# Patient Record
Sex: Male | Born: 1995 | Race: White | Hispanic: No | Marital: Married | Smoking: Never smoker
Health system: Southern US, Community
[De-identification: ages and names within clinical notes are randomized; demographics above are authoritative.]

## PROBLEM LIST (undated history)

## (undated) DIAGNOSIS — K219 Gastro-esophageal reflux disease without esophagitis: Secondary | ICD-10-CM

## (undated) DIAGNOSIS — F419 Anxiety disorder, unspecified: Secondary | ICD-10-CM

## (undated) HISTORY — PX: WISDOM TOOTH EXTRACTION: SHX21

## (undated) HISTORY — PX: TYMPANOSTOMY TUBE PLACEMENT: SHX32

---
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
## 2017-09-16 ENCOUNTER — Ambulatory Visit (HOSPITAL_COMMUNITY)
Admission: RE | Admit: 2017-09-16 | Discharge: 2017-09-16 | Disposition: A | Discharge status: Home / Self Care | Payer: 59 | Source: Ambulatory Visit | Attending: Family Medicine | Admitting: Family Medicine

## 2017-09-16 ENCOUNTER — Encounter: Payer: Self-pay | Admitting: Family Medicine

## 2017-09-16 ENCOUNTER — Ambulatory Visit (INDEPENDENT_AMBULATORY_CARE_PROVIDER_SITE_OTHER): Payer: 59 | Admitting: Family Medicine

## 2017-09-16 VITALS — BP 130/80 | Ht 73.0 in | Wt 218.5 lb

## 2017-09-16 DIAGNOSIS — M25561 Pain in right knee: Secondary | ICD-10-CM | POA: Insufficient documentation

## 2017-09-16 MED ORDER — ETODOLAC 400 MG PO TABS: ORAL_TABLET | ORAL | 3 refills | Status: DC

## 2017-09-16 NOTE — Progress Notes (Signed)
   Subjective:    Patient ID: Bradley Chapman, male    DOB: 10/21/1996, 21 y.o.   MRN: [id number]  Knee Pain   The incident occurred more than 1 week ago. There was no injury mechanism. The pain is present in the right knee. He has tried non-weight bearing, immobilization and rest for the symptoms.   Patient states no other concerns this visit.   machinging rcc,, learning the trade  Five wks ago, when crouching tried to get up and had a sudden severe pain and discomfor t  Had trouble putting pressure on it  On monday jumped out of vehicle and knee locked   Had inability to extend, had to work it out, now painful for the past few days   Took occas ibuprofen  Wore knee brace    Does a lot of walking with job     Review of Systems No headache, no major weight loss or weight gain, no chest pain no back pain abdominal pain no change in bowel habits complete ROS otherwise negative     Objective:   Physical Exam Alert vitals stable, NAD. Blood pressure good on repeat. HEENT normal. Lungs clear. Heart regular rate and rhythm. Right knee good range of motion.  Hyperlaxity noted.  Some medial joint line tenderness.  No obvious effusion.  Cruciate ligaments normal.  Impression       Assessment & Plan:  Impression knee injury.  With strain.  Elman L inflammation.  And intermittent locking.  Will do x-ray and trial of anti-inflammatory/if persists will need further imaging

## 2017-10-04 ENCOUNTER — Telehealth: Payer: Self-pay | Admitting: Family Medicine

## 2017-10-04 NOTE — Telephone Encounter (Signed)
rec ortho referral ot ortho of choice

## 2017-10-04 NOTE — Telephone Encounter (Signed)
Patient seen Dr. Brett CanalesSteve on 09/16/17 for knee pain.  His knee is no better.  Please advise.

## 2017-10-05 ENCOUNTER — Encounter: Payer: Self-pay | Admitting: Family Medicine

## 2017-10-05 ENCOUNTER — Other Ambulatory Visit: Payer: Self-pay | Admitting: *Deleted

## 2017-10-05 DIAGNOSIS — M25569 Pain in unspecified knee: Secondary | ICD-10-CM

## 2017-10-05 NOTE — Telephone Encounter (Signed)
Pt does not have a preference. Order for referral put in. Pt notified.

## 2017-10-07 ENCOUNTER — Encounter (INDEPENDENT_AMBULATORY_CARE_PROVIDER_SITE_OTHER): Payer: Self-pay | Admitting: Orthopaedic Surgery

## 2017-10-07 ENCOUNTER — Ambulatory Visit (INDEPENDENT_AMBULATORY_CARE_PROVIDER_SITE_OTHER): Payer: 59 | Admitting: Orthopaedic Surgery

## 2017-10-07 VITALS — BP 120/82 | HR 96 | Ht 73.0 in | Wt 220.0 lb

## 2017-10-07 DIAGNOSIS — M25561 Pain in right knee: Secondary | ICD-10-CM

## 2017-10-07 DIAGNOSIS — M2391 Unspecified internal derangement of right knee: Secondary | ICD-10-CM | POA: Diagnosis not present

## 2017-10-07 NOTE — Addendum Note (Signed)
Addended by: Rogers SeedsYEATTS, Michell Giuliano M on: 10/07/2017 02:28 PM   Modules accepted: Orders

## 2017-10-07 NOTE — Progress Notes (Signed)
Office Visit Note/orthopedic consultation requested by Dr. Lubertha SouthSteve Luking   Patient: Bradley AkinJacob F Chapman           Date of Birth: 12/01/1995           MRN: [id number] Visit Date: 10/07/2017              Requested by: Merlyn AlbertLuking, William S, MD [address] [place], [place] [address] PCP: Merlyn AlbertLuking, William S, MD   Assessment & Plan: Visit Diagnoses:  1. Knee locking, right     Plan: Patient exam is consistent with old ACL tear but he does not have a history of ligamentous injury.  No history of hemarthrosis or significant effusion.  His lateral joint line tenderness meniscal tear.  Prior to the injury 7 weeks ago.  We will obtain an MRI scan to evaluate his ACL and also the lateral meniscus for potential bucket-handle meniscal tear with repetitive locking.  Office follow-up after MRI scan.  Thank you for the opportunity to see him in consultation.  Follow-Up Instructions: No Follow-up on file.   Orders:  No orders of the defined types were placed in this encounter.  No orders of the defined types were placed in this encounter.     Procedures: No procedures performed   Clinical Data: No additional findings.   Subjective: Chief Complaint  Patient presents with  . Right Knee - Pain    HPI 21 year old injured his knee 2 weeks ago when he was down in a squat started to get up from a crouched position and felt a sudden pop in his knee.  Since that time he said several times were his knee has locked one episode it took him many minutes in order to get his knee out of the stuck position and get it unlocked.  He denies swelling he is to play soccer in high school and has taken some anti-inflammatory medication without relief.  He is tried to use the brace but his knee continues to lock and gives him extreme pain.  No past history of injury to his knee.  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  X-rays were obtained on 09/16/2017 which were normal.  These were reviewed  today on PACS.  Review of Systems for previous surgeries, negative for significant injuries negative for hospitalizations.  He does have some acid reflux and history of anxiety.  His only medication is been anti-inflammatories recently since his knee injury.  Otherwise negative as it pertains HPI 14 point review of systems done.   Objective: Vital Signs: BP 120/82   Pulse 96   Ht 6\' 1"  (1.854 m)   Wt 220 lb (99.8 kg)   BMI 29.03 kg/m   Physical Exam  Constitutional: He is oriented to person, place, and time. He appears well-developed and well-nourished.  HENT:  Head: Normocephalic and atraumatic.  Eyes: EOM are normal. Pupils are equal, round, and reactive to light.  Neck: No tracheal deviation present. No thyromegaly present.  Cardiovascular: Normal rate.  Pulmonary/Chest: Effort normal. He has no wheezes.  Abdominal: Soft. Bowel sounds are normal.  Neurological: He is alert and oriented to person, place, and time.  Skin: Skin is warm and dry. Capillary refill takes less than 2 seconds.  Psychiatric: He has a normal mood and affect. His behavior is normal. Judgment and thought content normal.    Ortho Exam right knee demonstrates trace effusion.  He has a 1-2+ anterior drawer with soft endpoint.  Positive pivot  shift positive Lockman.  Lateral joint line tenderness.  Pain with hyperextension.  He can flex to 120 degrees and then extend and did not lock today on exam.  Collateral ligaments are stable PCL exam is normal. Specialty Comments:  No specialty comments available.  Imaging: No results found.   PMFS History: Patient Active Problem List   Diagnosis Date Noted  . Depression 12/21/2014   Past Medical History:  Diagnosis Date  . Headache(784.0)     Family History  Problem Relation Age of Onset  . Stroke Paternal Grandfather        Died at 2962  . Heart attack Paternal Grandmother        Died at 2872  . Aneurysm Maternal Grandfather        Has Aortic Aneurysm  .  Aneurysm Maternal Grandmother         Aneurysm behind eye    Past Surgical History:  Procedure Laterality Date  . CIRCUMCISION  1999  . TYMPANOSTOMY TUBE PLACEMENT Bilateral 2000, 2001 and 2004   Social History   Occupational History  . Occupation: Consulting civil engineerstudent    Comment: [employer]  Tobacco Use  . Smoking status: Never Smoker  . Smokeless tobacco: Never Used  Substance and Sexual Activity  . Alcohol use: No  . Drug use: No  . Sexual activity: No

## 2017-10-14 ENCOUNTER — Ambulatory Visit (HOSPITAL_COMMUNITY)
Admission: RE | Admit: 2017-10-14 | Discharge: 2017-10-14 | Disposition: A | Discharge status: Home / Self Care | Payer: 59 | Source: Ambulatory Visit | Attending: Orthopaedic Surgery | Admitting: Orthopaedic Surgery

## 2017-10-14 DIAGNOSIS — S83281A Other tear of lateral meniscus, current injury, right knee, initial encounter: Secondary | ICD-10-CM | POA: Insufficient documentation

## 2017-10-14 DIAGNOSIS — M25561 Pain in right knee: Secondary | ICD-10-CM | POA: Insufficient documentation

## 2017-10-14 DIAGNOSIS — X58XXXA Exposure to other specified factors, initial encounter: Secondary | ICD-10-CM | POA: Diagnosis not present

## 2017-10-14 DIAGNOSIS — M2391 Unspecified internal derangement of right knee: Secondary | ICD-10-CM

## 2017-10-18 NOTE — Progress Notes (Signed)
Red white zone at his age I would try repair with acl - decent chance to heal

## 2017-10-20 ENCOUNTER — Ambulatory Visit (INDEPENDENT_AMBULATORY_CARE_PROVIDER_SITE_OTHER): Payer: 59 | Admitting: Orthopedic Surgery

## 2017-10-20 ENCOUNTER — Encounter (INDEPENDENT_AMBULATORY_CARE_PROVIDER_SITE_OTHER): Payer: Self-pay | Admitting: Orthopedic Surgery

## 2017-10-20 DIAGNOSIS — S83511D Sprain of anterior cruciate ligament of right knee, subsequent encounter: Secondary | ICD-10-CM | POA: Diagnosis not present

## 2017-10-21 ENCOUNTER — Ambulatory Visit (INDEPENDENT_AMBULATORY_CARE_PROVIDER_SITE_OTHER): Payer: 59 | Admitting: Orthopaedic Surgery

## 2017-10-21 NOTE — Progress Notes (Signed)
Office Visit Note   Patient: Bradley Chapman           Date of Birth: 09/13/1996           MRN: [id number] Visit Date: 10/20/2017 Requested by: Merlyn AlbertLuking, William S, MD [address] [place], [place] [address] PCP: Merlyn AlbertLuking, William S, MD  Subjective: Chief Complaint  Patient presents with  . Right Knee - Follow-up    HPI: Bradley PilgrimJacob is a 21 year old patient with right knee pain locking and instability.  He had an injury approximately 6 weeks ago.  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  There is no family history or personal history of DVT or pulmonary embolism.  Patient reports discrete locking episodes and lateral joint line tenderness.  His knee has locked about 4 times since the MRI scan.  The patient flexes and then rotates and extends the knee in order to unlock it.              ROS: All systems reviewed are negative as they relate to the chief complaint within the history of present illness.  Patient denies  fevers or chills.   Assessment & Plan: Visit Diagnoses:  1. Rupture of anterior cruciate ligament of right knee, subsequent encounter     Plan: Impression is right knee ACL tear lateral meniscal tear and posterior lateral rotatory instability.  Plan is ACL reconstruction with hamstring autograft likely a fibular based posterior lateral corner reconstruction and attempt at inside out lateral meniscal repair.  Risk and benefits are discussed including but not limited to infection knee stiffness nerve and vessel damage failure of the meniscal repair and the prolonged rehabilitative process involves also discussed.  All questions answered.  This patient is not particularly involved in cutting and pivoting sports.  Follow-Up Instructions: No Follow-up on file.   Orders:  No orders of the defined types were placed in this encounter.  No orders of the defined types were placed in this encounter.     Procedures: No procedures performed   Clinical Data: No additional  findings.  Objective: Vital Signs: There were no vitals taken for this visit.  Physical Exam:   Constitutional: Patient appears well-developed HEENT:  Head: Normocephalic Eyes:EOM are normal Neck: Normal range of motion Cardiovascular: Normal rate Pulmonary/chest: Effort normal Neurologic: Patient is alert Skin: Skin is warm Psychiatric: Patient has normal mood and affect    Ortho Exam: Orthopedic exam demonstrates normal gait and alignment.  ACL laxity is present on the right negative on the left.  The patient's knee is stable to valgus stress at 0 and 30 degrees on the right.  Some opening at 0 and 30 degrees to varus stress more so on the right compared to the left.  There is also about 15 degrees more posterior lateral rotatory instability tested at 15 degrees of flexion on the right compared to the left.  PCL is intact.  There is lateral joint line tenderness on the right.  The patient has full extension and full flexion.  Ankle dorsiflexion and plantarflexion is intact on the right and pedal pulses are palpable and present  Specialty Comments:  No specialty comments available.  Imaging: No results found.   PMFS History: Patient Active Problem List   Diagnosis Date Noted  . Depression 12/21/2014   Past Medical History:  Diagnosis Date  . Headache(784.0)     Family History  Problem Relation Age of Onset  . Stroke Paternal Grandfather  Died at 5562  . Heart attack Paternal Grandmother        Died at 9772  . Aneurysm Maternal Grandfather        Has Aortic Aneurysm  . Aneurysm Maternal Grandmother         Aneurysm behind eye    Past Surgical History:  Procedure Laterality Date  . CIRCUMCISION  1999  . TYMPANOSTOMY TUBE PLACEMENT Bilateral 2000, 2001 and 2004   Social History   Occupational History  . Occupation: Consulting civil engineerstudent    Comment: [employer]  Tobacco Use  . Smoking status: Never Smoker  . Smokeless tobacco: Never Used  Substance and  Sexual Activity  . Alcohol use: No  . Drug use: No  . Sexual activity: No

## 2017-10-26 ENCOUNTER — Other Ambulatory Visit (INDEPENDENT_AMBULATORY_CARE_PROVIDER_SITE_OTHER): Payer: Self-pay | Admitting: Orthopedic Surgery

## 2017-10-26 DIAGNOSIS — S83511A Sprain of anterior cruciate ligament of right knee, initial encounter: Secondary | ICD-10-CM

## 2017-10-28 ENCOUNTER — Other Ambulatory Visit: Payer: Self-pay

## 2017-10-28 ENCOUNTER — Encounter (HOSPITAL_COMMUNITY): Payer: Self-pay | Admitting: *Deleted

## 2017-10-28 NOTE — Progress Notes (Signed)
Spoke with mom, Misty for pre-op call (is pt's DPR). She denies any cardiac history for pt. States he is not diabetic.

## 2017-10-29 ENCOUNTER — Encounter (HOSPITAL_COMMUNITY): Payer: Self-pay | Admitting: Certified Registered Nurse Anesthetist

## 2017-10-29 ENCOUNTER — Ambulatory Visit (HOSPITAL_COMMUNITY): Payer: 59 | Admitting: Certified Registered Nurse Anesthetist

## 2017-10-29 ENCOUNTER — Encounter (INDEPENDENT_AMBULATORY_CARE_PROVIDER_SITE_OTHER): Payer: Self-pay | Admitting: Orthopedic Surgery

## 2017-10-29 ENCOUNTER — Ambulatory Visit (HOSPITAL_COMMUNITY)
Admission: RE | Admit: 2017-10-29 | Discharge: 2017-10-29 | Disposition: A | Discharge status: Home / Self Care | Payer: 59 | Source: Ambulatory Visit | Attending: Orthopedic Surgery | Admitting: Orthopedic Surgery

## 2017-10-29 ENCOUNTER — Encounter (HOSPITAL_COMMUNITY)
Admission: RE | Disposition: A | Discharge status: Home / Self Care | Payer: Self-pay | Source: Ambulatory Visit | Attending: Orthopedic Surgery

## 2017-10-29 DIAGNOSIS — S83289A Other tear of lateral meniscus, current injury, unspecified knee, initial encounter: Secondary | ICD-10-CM | POA: Diagnosis not present

## 2017-10-29 DIAGNOSIS — Z79899 Other long term (current) drug therapy: Secondary | ICD-10-CM | POA: Diagnosis not present

## 2017-10-29 DIAGNOSIS — S83511A Sprain of anterior cruciate ligament of right knee, initial encounter: Secondary | ICD-10-CM

## 2017-10-29 DIAGNOSIS — F329 Major depressive disorder, single episode, unspecified: Secondary | ICD-10-CM | POA: Insufficient documentation

## 2017-10-29 DIAGNOSIS — K219 Gastro-esophageal reflux disease without esophagitis: Secondary | ICD-10-CM | POA: Diagnosis not present

## 2017-10-29 DIAGNOSIS — F419 Anxiety disorder, unspecified: Secondary | ICD-10-CM | POA: Insufficient documentation

## 2017-10-29 DIAGNOSIS — S83281A Other tear of lateral meniscus, current injury, right knee, initial encounter: Secondary | ICD-10-CM | POA: Diagnosis not present

## 2017-10-29 DIAGNOSIS — M2420 Disorder of ligament, unspecified site: Secondary | ICD-10-CM | POA: Diagnosis not present

## 2017-10-29 HISTORY — DX: Gastro-esophageal reflux disease without esophagitis: K21.9

## 2017-10-29 HISTORY — PX: ANTERIOR CRUCIATE LIGAMENT REPAIR: SHX115

## 2017-10-29 HISTORY — DX: Anxiety disorder, unspecified: F41.9

## 2017-10-29 LAB — CBC
HEMATOCRIT: 45.6 % (ref 39.0–52.0)
Hemoglobin: 15.5 g/dL (ref 13.0–17.0)
MCH: 30.8 pg (ref 26.0–34.0)
MCHC: 34 g/dL (ref 30.0–36.0)
MCV: 90.5 fL (ref 78.0–100.0)
PLATELETS: 245 10*3/uL (ref 150–400)
RBC: 5.04 MIL/uL (ref 4.22–5.81)
RDW: 12.5 % (ref 11.5–15.5)
WBC: 6.6 10*3/uL (ref 4.0–10.5)

## 2017-10-29 SURGERY — RECONSTRUCTION, KNEE, ACL, USING HAMSTRING GRAFT
Anesthesia: Regional | Site: Knee | Laterality: Right

## 2017-10-29 MED ORDER — ROCURONIUM BROMIDE 100 MG/10ML IV SOLN
INTRAVENOUS | Status: DC | PRN
  Administered 2017-10-29: 50 mg via INTRAVENOUS

## 2017-10-29 MED ORDER — MORPHINE SULFATE (PF) 4 MG/ML IV SOLN
INTRAVENOUS | Status: AC
  Filled 2017-10-29: qty 2

## 2017-10-29 MED ORDER — 0.9 % SODIUM CHLORIDE (POUR BTL) OPTIME
TOPICAL | Status: DC | PRN
  Administered 2017-10-29: 1000 mL

## 2017-10-29 MED ORDER — MIDAZOLAM HCL 2 MG/2ML IJ SOLN
INTRAMUSCULAR | Status: AC
  Filled 2017-10-29: qty 2

## 2017-10-29 MED ORDER — FENTANYL CITRATE (PF) 250 MCG/5ML IJ SOLN
INTRAMUSCULAR | Status: AC
  Filled 2017-10-29: qty 5

## 2017-10-29 MED ORDER — ONDANSETRON HCL 4 MG/2ML IJ SOLN: 4.0000 mg | Freq: Once | INTRAMUSCULAR | Status: DC | PRN

## 2017-10-29 MED ORDER — MORPHINE SULFATE (PF) 4 MG/ML IV SOLN
INTRAVENOUS | Status: DC | PRN
  Administered 2017-10-29: 8 mg

## 2017-10-29 MED ORDER — FENTANYL CITRATE (PF) 100 MCG/2ML IJ SOLN
INTRAMUSCULAR | Status: AC
  Administered 2017-10-29: 100 ug via INTRAVENOUS
  Filled 2017-10-29: qty 2

## 2017-10-29 MED ORDER — LIDOCAINE 2% (20 MG/ML) 5 ML SYRINGE
INTRAMUSCULAR | Status: DC | PRN
  Administered 2017-10-29: 100 mg via INTRAVENOUS

## 2017-10-29 MED ORDER — CLONIDINE HCL (ANALGESIA) 100 MCG/ML EP SOLN
150.0000 ug | EPIDURAL | Status: DC
  Filled 2017-10-29: qty 1.5

## 2017-10-29 MED ORDER — DEXAMETHASONE SODIUM PHOSPHATE 10 MG/ML IJ SOLN
INTRAMUSCULAR | Status: DC | PRN
  Administered 2017-10-29: 10 mg via INTRAVENOUS

## 2017-10-29 MED ORDER — MEPERIDINE HCL 25 MG/ML IJ SOLN: 6.2500 mg | INTRAMUSCULAR | Status: DC | PRN

## 2017-10-29 MED ORDER — DEXAMETHASONE SODIUM PHOSPHATE 10 MG/ML IJ SOLN
INTRAMUSCULAR | Status: AC
  Filled 2017-10-29: qty 1

## 2017-10-29 MED ORDER — SUGAMMADEX SODIUM 200 MG/2ML IV SOLN
INTRAVENOUS | Status: DC | PRN
  Administered 2017-10-29: 200 mg via INTRAVENOUS

## 2017-10-29 MED ORDER — ONDANSETRON HCL 4 MG/2ML IJ SOLN
INTRAMUSCULAR | Status: AC
  Filled 2017-10-29: qty 2

## 2017-10-29 MED ORDER — SODIUM CHLORIDE 0.9 % IR SOLN
Status: DC | PRN
  Administered 2017-10-29 (×2): 3000 mL

## 2017-10-29 MED ORDER — PROPOFOL 10 MG/ML IV BOLUS
INTRAVENOUS | Status: AC
  Filled 2017-10-29: qty 20

## 2017-10-29 MED ORDER — LACTATED RINGERS IV SOLN
INTRAVENOUS | Status: DC | PRN
  Administered 2017-10-29 (×2): via INTRAVENOUS

## 2017-10-29 MED ORDER — BUPIVACAINE-EPINEPHRINE (PF) 0.5% -1:200000 IJ SOLN
INTRAMUSCULAR | Status: AC
  Filled 2017-10-29: qty 30

## 2017-10-29 MED ORDER — HYDROMORPHONE HCL 1 MG/ML IJ SOLN: 0.2500 mg | INTRAMUSCULAR | Status: DC | PRN

## 2017-10-29 MED ORDER — ONDANSETRON HCL 4 MG/2ML IJ SOLN
INTRAMUSCULAR | Status: DC | PRN
  Administered 2017-10-29: 4 mg via INTRAVENOUS

## 2017-10-29 MED ORDER — MIDAZOLAM HCL 2 MG/2ML IJ SOLN
INTRAMUSCULAR | Status: AC
Start: 2017-10-29 — End: 2017-10-29
  Administered 2017-10-29: 2 mg via INTRAVENOUS
  Filled 2017-10-29: qty 2

## 2017-10-29 MED ORDER — PROPOFOL 10 MG/ML IV BOLUS
INTRAVENOUS | Status: DC | PRN
  Administered 2017-10-29: 150 mg via INTRAVENOUS

## 2017-10-29 MED ORDER — ROPIVACAINE HCL 7.5 MG/ML IJ SOLN
INTRAMUSCULAR | Status: DC | PRN
  Administered 2017-10-29: 20 mL via PERINEURAL

## 2017-10-29 MED ORDER — EPINEPHRINE PF 1 MG/ML IJ SOLN
INTRAMUSCULAR | Status: AC
  Filled 2017-10-29: qty 4

## 2017-10-29 MED ORDER — LIDOCAINE 2% (20 MG/ML) 5 ML SYRINGE
INTRAMUSCULAR | Status: AC
  Filled 2017-10-29: qty 5

## 2017-10-29 MED ORDER — BUPIVACAINE-EPINEPHRINE 0.5% -1:200000 IJ SOLN
INTRAMUSCULAR | Status: DC | PRN
  Administered 2017-10-29: 10 mL

## 2017-10-29 MED ORDER — CEFAZOLIN SODIUM-DEXTROSE 2-4 GM/100ML-% IV SOLN
INTRAVENOUS | Status: AC
  Filled 2017-10-29: qty 100

## 2017-10-29 MED ORDER — BUPIVACAINE HCL 0.5 % IJ SOLN
INTRAMUSCULAR | Status: DC | PRN
  Administered 2017-10-29: 30 mL via INTRA_ARTICULAR

## 2017-10-29 MED ORDER — CHLORHEXIDINE GLUCONATE 4 % EX LIQD: 60.0000 mL | Freq: Once | CUTANEOUS | Status: DC

## 2017-10-29 MED ORDER — FENTANYL CITRATE (PF) 100 MCG/2ML IJ SOLN
100.0000 ug | Freq: Once | INTRAMUSCULAR | Status: AC
  Administered 2017-10-29: 100 ug via INTRAVENOUS
  Filled 2017-10-29: qty 2

## 2017-10-29 MED ORDER — LACTATED RINGERS IV SOLN
INTRAVENOUS | Status: DC
  Administered 2017-10-29: 10:00:00 via INTRAVENOUS

## 2017-10-29 MED ORDER — ROCURONIUM BROMIDE 10 MG/ML (PF) SYRINGE
PREFILLED_SYRINGE | INTRAVENOUS | Status: AC
  Filled 2017-10-29: qty 5

## 2017-10-29 MED ORDER — CHLORHEXIDINE GLUCONATE 4 % EX LIQD
60.0000 mL | Freq: Once | CUTANEOUS | Status: DC
Start: 2017-10-29 — End: 2017-10-29

## 2017-10-29 MED ORDER — MIDAZOLAM HCL 2 MG/2ML IJ SOLN
2.0000 mg | Freq: Once | INTRAMUSCULAR | Status: AC
  Administered 2017-10-29: 2 mg via INTRAVENOUS
  Filled 2017-10-29: qty 2

## 2017-10-29 MED ORDER — BUPIVACAINE HCL (PF) 0.5 % IJ SOLN
INTRAMUSCULAR | Status: AC
  Filled 2017-10-29: qty 30

## 2017-10-29 MED ORDER — MIDAZOLAM HCL 5 MG/5ML IJ SOLN
INTRAMUSCULAR | Status: DC | PRN
  Administered 2017-10-29: 2 mg via INTRAVENOUS

## 2017-10-29 MED ORDER — CEFAZOLIN SODIUM-DEXTROSE 2-4 GM/100ML-% IV SOLN
2.0000 g | INTRAVENOUS | Status: AC
  Administered 2017-10-29: 2 g via INTRAVENOUS

## 2017-10-29 MED ORDER — CLONIDINE HCL (ANALGESIA) 100 MCG/ML EP SOLN
EPIDURAL | Status: DC | PRN
  Administered 2017-10-29: 100 ug via INTRA_ARTICULAR

## 2017-10-29 MED ORDER — FENTANYL CITRATE (PF) 100 MCG/2ML IJ SOLN
INTRAMUSCULAR | Status: DC | PRN
  Administered 2017-10-29: 100 ug via INTRAVENOUS
  Administered 2017-10-29: 50 ug via INTRAVENOUS

## 2017-10-29 SURGICAL SUPPLY — 86 items
ALCOHOL 70% 16 OZ (MISCELLANEOUS) ×2 IMPLANT
BANDAGE ESMARK 6X9 LF (GAUZE/BANDAGES/DRESSINGS) IMPLANT
BLADE CUTTER GATOR 3.5 (BLADE) IMPLANT
BLADE CUTTER MENIS 5.5 (BLADE) ×1 IMPLANT
BLADE GREAT WHITE 4.2 (BLADE) ×1 IMPLANT
BLADE SURG 10 STRL SS (BLADE) ×1 IMPLANT
BLADE SURG 15 STRL LF DISP TIS (BLADE) IMPLANT
BLADE SURG 15 STRL SS (BLADE) ×2
BNDG CMPR 9X6 STRL LF SNTH (GAUZE/BANDAGES/DRESSINGS)
BNDG CMPR MED 10X6 ELC LF (GAUZE/BANDAGES/DRESSINGS) ×1
BNDG CMPR MED 15X6 ELC VLCR LF (GAUZE/BANDAGES/DRESSINGS)
BNDG ELASTIC 6X10 VLCR STRL LF (GAUZE/BANDAGES/DRESSINGS) ×1 IMPLANT
BNDG ELASTIC 6X15 VLCR STRL LF (GAUZE/BANDAGES/DRESSINGS) ×1 IMPLANT
BNDG ESMARK 6X9 LF (GAUZE/BANDAGES/DRESSINGS)
BUR OVAL 6.0 (BURR) ×1 IMPLANT
CANNULA 5.75X71 LONG (CANNULA) ×1 IMPLANT
COVER MAYO STAND STRL (DRAPES) ×1 IMPLANT
COVER SURGICAL LIGHT HANDLE (MISCELLANEOUS) ×2 IMPLANT
CUFF TOURNIQUET SINGLE 34IN LL (TOURNIQUET CUFF) ×1 IMPLANT
CUFF TOURNIQUET SINGLE 44IN (TOURNIQUET CUFF) IMPLANT
DRAPE ARTHROSCOPY W/POUCH 114 (DRAPES) ×2 IMPLANT
DRAPE INCISE IOBAN 66X45 STRL (DRAPES) ×3 IMPLANT
DRAPE ORTHO SPLIT 77X108 STRL (DRAPES) ×2
DRAPE SURG ORHT 6 SPLT 77X108 (DRAPES) ×1 IMPLANT
DRAPE U-SHAPE 47X51 STRL (DRAPES) ×2 IMPLANT
DRILL FLIPCUTTER II 7.5MM (MISCELLANEOUS) IMPLANT
DRILL FLIPCUTTER II 8.0MM (INSTRUMENTS) IMPLANT
DRILL FLIPCUTTER II 8.5MM (INSTRUMENTS) IMPLANT
DRILL FLIPCUTTER II 9.0MM (INSTRUMENTS) IMPLANT
DRSG TEGADERM 4X4.75 (GAUZE/BANDAGES/DRESSINGS) ×4 IMPLANT
DURAPREP 26ML APPLICATOR (WOUND CARE) ×3 IMPLANT
FIBERLOOP 2 0 (SUTURE) ×8 IMPLANT
FLIPCUTTER II 7.5MM (MISCELLANEOUS)
FLIPCUTTER II 8.0MM (INSTRUMENTS)
FLIPCUTTER II 8.5MM (INSTRUMENTS)
FLIPCUTTER II 9.0MM (INSTRUMENTS)
GAUZE SPONGE 4X4 12PLY STRL (GAUZE/BANDAGES/DRESSINGS) ×2 IMPLANT
GAUZE XEROFORM 1X8 LF (GAUZE/BANDAGES/DRESSINGS) ×1 IMPLANT
GLOVE BIOGEL PI IND STRL 7.5 (GLOVE) ×1 IMPLANT
GLOVE BIOGEL PI IND STRL 8 (GLOVE) ×1 IMPLANT
GLOVE BIOGEL PI INDICATOR 7.5 (GLOVE) ×1
GLOVE BIOGEL PI INDICATOR 8 (GLOVE) ×1
GLOVE ORTHO TXT STRL SZ7.5 (GLOVE) ×2 IMPLANT
GLOVE SURG ORTHO 8.0 STRL STRW (GLOVE) ×2 IMPLANT
GLOVE SURG SS PI 7.0 STRL IVOR (GLOVE) ×2 IMPLANT
GOWN STRL REUS W/ TWL LRG LVL3 (GOWN DISPOSABLE) ×3 IMPLANT
GOWN STRL REUS W/TWL LRG LVL3 (GOWN DISPOSABLE) ×6
KIT BASIN OR (CUSTOM PROCEDURE TRAY) ×2 IMPLANT
KIT BIOCARTILAGE DEL W/SYRINGE (KITS) IMPLANT
KIT BIOCARTILAGE LG JOINT MIX (KITS) ×2 IMPLANT
KIT ROOM TURNOVER OR (KITS) ×2 IMPLANT
MANIFOLD NEPTUNE II (INSTRUMENTS) ×2 IMPLANT
NDL FILTER BLUNT 18X1 1/2 (NEEDLE) IMPLANT
NDL SUT 2-0 SCORPION KNEE (NEEDLE) IMPLANT
NEEDLE 18GX1X1/2 (RX/OR ONLY) (NEEDLE) ×2 IMPLANT
NEEDLE 22X1 1/2 (OR ONLY) (NEEDLE) ×1 IMPLANT
NEEDLE FILTER BLUNT 18X 1/2SAF (NEEDLE) ×1
NEEDLE FILTER BLUNT 18X1 1/2 (NEEDLE) ×1 IMPLANT
NEEDLE SUT 2-0 SCORPION KNEE (NEEDLE) ×2 IMPLANT
NS IRRIG 1000ML POUR BTL (IV SOLUTION) ×2 IMPLANT
PACK ARTHROSCOPY DSU (CUSTOM PROCEDURE TRAY) ×2 IMPLANT
PAD ARMBOARD 7.5X6 YLW CONV (MISCELLANEOUS) ×4 IMPLANT
PADDING CAST COTTON 6X4 STRL (CAST SUPPLIES) ×1 IMPLANT
SET ARTHROSCOPY TUBING (MISCELLANEOUS) ×2
SET ARTHROSCOPY TUBING LN (MISCELLANEOUS) ×1 IMPLANT
SPONGE LAP 18X18 X RAY DECT (DISPOSABLE) ×2 IMPLANT
STRIP CLOSURE SKIN 1/2X4 (GAUZE/BANDAGES/DRESSINGS) IMPLANT
SUCTION FRAZIER HANDLE 10FR (MISCELLANEOUS) ×1
SUCTION TUBE FRAZIER 10FR DISP (MISCELLANEOUS) ×1 IMPLANT
SUT ETHILON 3 0 PS 1 (SUTURE) ×6 IMPLANT
SUT MNCRL AB 3-0 PS2 18 (SUTURE) ×2 IMPLANT
SUT VIC AB 0 CT1 27 (SUTURE) ×2
SUT VIC AB 0 CT1 27XBRD ANBCTR (SUTURE) ×1 IMPLANT
SUT VIC AB 2-0 CT1 27 (SUTURE) ×2
SUT VIC AB 2-0 CT1 TAPERPNT 27 (SUTURE) ×1 IMPLANT
SUT VICRYL 0 UR6 27IN ABS (SUTURE) ×2 IMPLANT
SYR 30ML LL (SYRINGE) ×2 IMPLANT
SYR 3ML LL SCALE MARK (SYRINGE) ×2 IMPLANT
SYR 5ML LL (SYRINGE) ×2 IMPLANT
SYR BULB IRRIGATION 50ML (SYRINGE) ×2 IMPLANT
SYR TB 1ML LUER SLIP (SYRINGE) ×2 IMPLANT
TOWEL OR 17X26 10 PK STRL BLUE (TOWEL DISPOSABLE) ×2 IMPLANT
UNDERPAD 30X30 (UNDERPADS AND DIAPERS) ×2 IMPLANT
WAND SERFAS ENERGY SUPER 90 (SURGICAL WAND) IMPLANT
WRAP KNEE MAXI GEL POST OP (GAUZE/BANDAGES/DRESSINGS) IMPLANT
YANKAUER SUCT BULB TIP NO VENT (SUCTIONS) ×2 IMPLANT

## 2017-10-29 NOTE — Anesthesia Postprocedure Evaluation (Signed)
Anesthesia Post Note  Patient: Ashley AkinJacob F Marchuk  Procedure(s) Performed: RIGHT KNEE ARTHROSCOPY LATERAL MENISCAL REPAIR (Right Knee)     Patient location during evaluation: PACU Anesthesia Type: Regional and General Level of consciousness: awake and alert Pain management: pain level controlled Vital Signs Assessment: post-procedure vital signs reviewed and stable Respiratory status: spontaneous breathing, nonlabored ventilation and respiratory function stable Cardiovascular status: blood pressure returned to baseline and stable Postop Assessment: no apparent nausea or vomiting Anesthetic complications: no    Last Vitals:  Vitals:   10/29/17 1945 10/29/17 1955  BP:  117/64  Pulse: 100 100  Resp: 17 15  Temp:    SpO2: 90% 98%    Last Pain:  Vitals:   10/29/17 0931  TempSrc: Oral                 Teiana Hajduk,W. EDMOND

## 2017-10-29 NOTE — Brief Op Note (Signed)
10/29/2017  7:14 PM  PATIENT:  Ashley AkinJacob F Massengale  21 y.o. male  PRE-OPERATIVE DIAGNOSIS:  right knee anterior cruciate ligament tear, lateral meniscal tear,   POST-OPERATIVE DIAGNOSIS:  right knee tear lateral meniscal , stretched but intact acl  PROCEDURE:  Procedure(s): RIGHT KNEE ARTHROSCOPY LATERAL MENISCAL REPAIR  SURGEON:  Surgeon(s): August Saucerean, Corrie MckusickGregory Scott, MD  ASSISTANT: Patrick Jupiterarla Bethune rnfa  ANESTHESIA:   general  EBL: 12 ml    No intake/output data recorded.  BLOOD ADMINISTERED: none  DRAINS: none   LOCAL MEDICATIONS USED:  Marcaine mso4 clonidine  SPECIMEN:  No Specimen  COUNTS:  YES  TOURNIQUET:  * No tourniquets in log *  DICTATION: .Other Dictation: Dictation Number [id number]  PLAN OF CARE: Discharge to home after PACU  PATIENT DISPOSITION:  PACU - hemodynamically stable

## 2017-10-29 NOTE — Anesthesia Procedure Notes (Signed)
Anesthesia Regional Block: Adductor canal block   Pre-Anesthetic Checklist: ,, timeout performed, Correct Patient, Correct Site, Correct Laterality, Correct Procedure, Correct Position, site marked, Risks and benefits discussed,  Surgical consent,  Pre-op evaluation,  At surgeon's request and post-op pain management  Laterality: Right  Prep: chloraprep       Needles:  Injection technique: Single-shot  Needle Type: Echogenic Stimulator Needle     Needle Length: 9cm  Needle Gauge: 21     Additional Needles:   Narrative:  Start time: 10/29/2017 2:05 PM End time: 10/29/2017 2:15 PM Injection made incrementally with aspirations every 5 mL.  Performed by: Personally  Anesthesiologist: Arta Brucessey, Janeal Abadi, MD  Additional Notes: Monitors applied. Patient sedated. Sterile prep and drape,hand hygiene and sterile gloves were used. Relevant anatomy identified.Needle position confirmed.Local anesthetic injected incrementally after negative aspiration. Local anesthetic spread visualized around nerve(s). Vascular puncture avoided. No complications. Image printed for medical record.The patient tolerated the procedure well.    Arta BruceKevin Simar Pothier MD

## 2017-10-29 NOTE — Anesthesia Preprocedure Evaluation (Signed)
Anesthesia Evaluation  Patient identified by MRN, date of birth, ID band Patient awake    Reviewed: Allergy & Precautions, NPO status , Patient's Chart, lab work & pertinent test results  Airway Mallampati: I  TM Distance: >3 FB Neck ROM: Full    Dental   Pulmonary    Pulmonary exam normal        Cardiovascular Normal cardiovascular exam     Neuro/Psych Anxiety Depression    GI/Hepatic GERD  Medicated and Controlled,  Endo/Other    Renal/GU      Musculoskeletal   Abdominal   Peds  Hematology   Anesthesia Other Findings   Reproductive/Obstetrics                             Anesthesia Physical Anesthesia Plan  ASA: II  Anesthesia Plan: General   Post-op Pain Management:  Regional for Post-op pain   Induction: Intravenous  PONV Risk Score and Plan: 2 and Ondansetron  Airway Management Planned: LMA  Additional Equipment:   Intra-op Plan:   Post-operative Plan: Extubation in OR  Informed Consent: I have reviewed the patients History and Physical, chart, labs and discussed the procedure including the risks, benefits and alternatives for the proposed anesthesia with the patient or authorized representative who has indicated his/her understanding and acceptance.     Plan Discussed with: CRNA and Surgeon  Anesthesia Plan Comments:         Anesthesia Quick Evaluation

## 2017-10-29 NOTE — Transfer of Care (Signed)
Immediate Anesthesia Transfer of Care Note  Patient: Ashley AkinJacob F [place]  Procedure(s) Performed: RIGHT KNEE ARTHROSCOPY LATERAL MENISCAL REPAIR (Right Knee)  Patient Location: PACU  Anesthesia Type:General and GA combined with regional for post-op pain  Level of Consciousness: oriented, sedated, drowsy, patient cooperative and responds to stimulation  Airway & Oxygen Therapy: Patient Spontanous Breathing and Patient connected to nasal cannula oxygen  Post-op Assessment: Report given to RN, Post -op Vital signs reviewed and stable and Patient moving all extremities X 4  Post vital signs: Reviewed and stable  Last Vitals:  Vitals:   10/29/17 0931 10/29/17 1925  BP: (!) 149/78   Pulse: 93   Resp: 18   Temp: 36.8 C 37.2 C  SpO2: 100%     Last Pain:  Vitals:   10/29/17 0931  TempSrc: Oral      Patients Stated Pain Goal: 2 (10/29/17 1014)  Complications: No apparent anesthesia complications

## 2017-10-29 NOTE — Anesthesia Procedure Notes (Signed)
Procedure Name: Intubation Date/Time: 10/29/2017 5:28 PM Performed by: Julian ReilWelty, Farrell Pantaleo F, CRNA Pre-anesthesia Checklist: Patient identified, Emergency Drugs available, Suction available, Patient being monitored and Timeout performed Patient Re-evaluated:Patient Re-evaluated prior to induction Oxygen Delivery Method: Circle system utilized Preoxygenation: Pre-oxygenation with 100% oxygen Induction Type: IV induction Ventilation: Mask ventilation without difficulty Laryngoscope Size: Miller and 3 Grade View: Grade I Tube type: Oral Tube size: 7.5 mm Number of attempts: 1 Airway Equipment and Method: Stylet Placement Confirmation: ETT inserted through vocal cords under direct vision,  positive ETCO2 and breath sounds checked- equal and bilateral Secured at: 24 cm Tube secured with: Tape Dental Injury: Teeth and Oropharynx as per pre-operative assessment  Comments: 4x4s bite block used.

## 2017-10-29 NOTE — H&P (Signed)
Bradley AkinJacob F Chapman is an 21 y.o. male.   Chief Complaint: Right knee pain and locking HPI: Bradley PilgrimJacob is a 21 year old patient with right knee pain and locking.  He injured his knee when he was standing up from a crouched position.  He was initially seen by 1 of my partners.  He has had repeated episodes of locking of the right knee.  MRI scan shows lateral meniscal tear and no bone bruising on the lateral condyles.  ACL appears intact on the MRI scan.  He does report some episodes of locking and giving way in the right knee.  His mechanism of injury is less consistent with ACL tear and more consistent with lateral meniscal tear.  No family history of DVT or pulmonary embolism Past Medical History:  Diagnosis Date  . Anxiety   . GERD (gastroesophageal reflux disease)     Past Surgical History:  Procedure Laterality Date  . CIRCUMCISION  1999  . TYMPANOSTOMY TUBE PLACEMENT Bilateral 2000, 2001 and 2004  . WISDOM TOOTH EXTRACTION      Family History  Problem Relation Age of Onset  . Hypertension Father   . Hyperlipidemia Father   . Aneurysm Maternal Grandmother         Aneurysm behind eye  . Aneurysm Maternal Grandfather        Has Aortic Aneurysm  . Heart attack Paternal Grandmother        Died at 3472  . Stroke Paternal Grandfather        Died at [age]   Social History:  reports that  has never smoked. he has never used smokeless tobacco. He reports that he does not drink alcohol or use drugs.  Allergies: No Known Allergies  Medications Prior to Admission  Medication Sig Dispense Refill  . famotidine-calcium carbonate-magnesium hydroxide (PEPCID COMPLETE) [phone] MG chewable tablet Chew 1 tablet by mouth daily as needed (for heartburn).    Marland Kitchen. etodolac (LODINE) 400 MG tablet Take 1 tablet by mouth twice a day with food (Patient not taking: Reported on 10/07/2017) 28 tablet 3  . sertraline (ZOLOFT) 100 MG tablet Take 1 tablet (100 mg total) by mouth daily. (Patient not taking: Reported on  09/16/2017) 30 tablet 3    Results for orders placed or performed during the hospital encounter of 10/29/17 (from the past 48 hour(s))  CBC     Status: None   Collection Time: 10/29/17  9:47 AM  Result Value Ref Range   WBC 6.6 4.0 - 10.5 K/uL   RBC 5.04 4.22 - 5.81 MIL/uL   Hemoglobin 15.5 13.0 - 17.0 g/dL   HCT 64.445.6 03.439.0 - 74.252.0 %   MCV 90.5 78.0 - 100.0 fL   MCH 30.8 26.0 - 34.0 pg   MCHC 34.0 30.0 - 36.0 g/dL   RDW 59.512.5 63.811.5 - 75.615.5 %   Platelets 245 150 - 400 K/uL   No results found.  Review of Systems  Musculoskeletal: Positive for joint pain.  All other systems reviewed and are negative.   Blood pressure (!) 149/78, pulse 93, temperature 98.3 F (36.8 C), temperature source Oral, resp. rate 18, height 6\' 2"  (1.88 m), weight 220 lb (99.8 kg), SpO2 100 %. Physical Exam  Constitutional: He appears well-developed.  HENT:  Head: Normocephalic.  Eyes: Pupils are equal, round, and reactive to light.  Neck: Normal range of motion.  Cardiovascular: Normal rate.  Respiratory: Effort normal.  Neurological: He is alert.  Skin: Skin is warm.  Psychiatric: He has a normal  mood and affect.  Examination of both knees demonstrates about 15 degrees of hyperextension bilaterally.  No real significant difference in posterior lateral corner rotational instability right leg versus left.  Both subtend about a 20 degree arc of motion with good endpoint.  On Lachman and anterior drawer exam there is about 1-2 mm more laxity on the right than the left but there is an endpoint.  There is some lateral sided joint line tenderness on the right side compared to the left..  Pedal pulses palpable.  No other masses no adenopathy or skin changes noted in the right knee region  Assessment/Plan Impression is left knee locking with only slight amount of increased laxity on the right compared to the left.  MRI scan shows lateral meniscal tear with what appears to be intact ACL.  There is a very slight  side-to-side difference in laxity on examination and therefore we will look at the ACL arthroscopically before deciding on any type of reconstruction.  Plan will be for lateral meniscal debridement versus repair.  The patient has significant hyperextension bilaterally but I do not think he has posterior lateral corner laxity comparing right to left side.  Patient understands the risk and benefits.  All questions answered.  Burnard BuntingG Scott Dean, MD 10/29/2017, 5:11 PM

## 2017-10-29 NOTE — Progress Notes (Signed)
Orthopedic Tech Progress Note Patient Details:  Ashley AkinJacob F Altic 11/08/1996 [id number]  Ortho Devices Type of Ortho Device: Crutches Ortho Device/Splint Location: fitted and trained pt for crutch use.  pt was still a little sleepy however pt did well with ambulation.  Mother at bedside.  Ortho Device/Splint Interventions: Application, Adjustment   Post Interventions Patient Tolerated: Ambulated well, Well Instructions Provided: Adjustment of device, Care of device, Poper ambulation with device   Alvina ChouWilliams, Sheresa Cullop C 10/29/2017, 8:27 PM

## 2017-10-30 ENCOUNTER — Encounter (HOSPITAL_COMMUNITY): Payer: Self-pay | Admitting: Orthopedic Surgery

## 2017-10-30 DIAGNOSIS — S83521A Sprain of posterior cruciate ligament of right knee, initial encounter: Secondary | ICD-10-CM | POA: Diagnosis not present

## 2017-10-30 DIAGNOSIS — S83511D Sprain of anterior cruciate ligament of right knee, subsequent encounter: Secondary | ICD-10-CM | POA: Diagnosis not present

## 2017-11-03 NOTE — Op Note (Signed)
NAME:  Leverne Chapman, Bradley Chapman                     ACCOUNT NO.:  MEDICAL RECORD NO.:  [id number]  LOCATION:                                 FACILITY:  PHYSICIAN:  Burnard BuntingG. Scott Dean, M.D.         DATE OF BIRTH:  DATE OF PROCEDURE: DATE OF DISCHARGE:                              OPERATIVE REPORT   PREOPERATIVE DIAGNOSIS:  Right knee possible anterior cruciate ligament tear and lateral meniscal tear.  POSTOPERATIVE DIAGNOSIS:  Right knee mild anterior cruciate ligament laxity, but functionally intact with negative pivot shift, pivot glide, and tear of the lateral meniscus with horizontal and vertical components.  PROCEDURE:  Right knee arthroscopy with partial lateral meniscectomy and repair of the remaining horizontal cleavage component of the lateral meniscal tear.  SURGEON:  Burnard BuntingG. Scott Dean, MD.  ASSISTANT:  Patrick Jupiterarla Bethune, RNFA.  INDICATIONS:  Bradley Chapman is a 21 year old patient who injured his right knee getting up from a squatting position.  The patient had laxity on examination in the clinic, but has had multiple episodes of primarily locking in the knee.  He is not particularly athletic, but does do shop- type classes.  He presents now for operative management of mechanical symptoms in the right knee.  OPERATIVE FINDINGS: 1. Examination under anesthesia:  Bilateral knees demonstrate     hyperextension of about 15 degrees.  The patient had about 1-2 mm     more anterior laxity on Lachman examination right versus left, but     negative pivot shift, negative pivot glide on the right and left-     hand side.  There was no asymmetric posterolateral rotatory     instability, right versus left.  Collaterals were stable to varus     and valgus stress at 0 and 30 degrees on both sides. 2. Diagnostic and operative arthroscopy:     a.     Intact patellofemoral compartment.     b.     Intact medial compartment, articular cartilage, and      meniscus.     c.     Intact ACL and PCL, although there  was some slight laxity to      the ACL.  There was no evidence of traumatic injury.  There was no      evidence of partial tearing, only slight laxity of the ACL.      Arthroscopic Lachman's demonstrated solid endpoint with about 3-4      mm of anterior translation.  There was also a tear of that lateral      meniscus with both horizontal and vertical components.  The      irreparable portion of the tear was about 50% of the anterior-      posterior width of the tear over two-thirds circumference.  PROCEDURE IN DETAIL:  The patient was brought to the operating room where general anesthetic was induced.  Preoperative antibiotics were administered.  Time-out was called.  Both legs were examined under anesthesia.  After examination under anesthesia, time-out was called. Right leg was prescrubbed with alcohol and Betadine, prepped with DuraPrep and draped in sterile manner.  Anterior and inferolateral portals  were established.  Anterior, inferior, and medial portals were established under direct visualization.  Diagnostic arthroscopy was performed.  Medial compartment intact.  ACL and PCL were intact, although there was a little bit of laxity with inspection of the ACL. It was, however, functionally intact with arthroscopic Lachman showing about 3-4 mm of anterior translation with a solid endpoint, definitely solid endpoint on both the right and left leg.  The lateral compartment demonstrated a tear of the lateral meniscus involving the posterior two- thirds circumference.  This had both vertical and horizontal cleavage component.  Tear through the white zone was debrided.  The remaining tear was rasped with a meniscal rasp, and three sutures using 2-0 FiberWire sutures were placed using the Knee Scorpion.  This closed the horizontal cleavage component of the tear.  Nice repair was achieved. Thorough irrigation of the joint was performed and the 2 portals were closed using 2-0 Vicryl and 3-0  nylon.  The patient tolerated the procedure well without immediate complication, transferred to the recovery room in stable condition.  Portals were covered with waterproof dressings.  Knee brace was placed.     Burnard BuntingG. Scott Dean, M.D.     GSD/MEDQ  D:  10/29/2017  T:  10/30/2017  Job:  [id number]

## 2017-11-24 ENCOUNTER — Ambulatory Visit (INDEPENDENT_AMBULATORY_CARE_PROVIDER_SITE_OTHER): Payer: 59 | Admitting: Orthopedic Surgery

## 2017-11-24 ENCOUNTER — Encounter (INDEPENDENT_AMBULATORY_CARE_PROVIDER_SITE_OTHER): Payer: Self-pay | Admitting: Orthopedic Surgery

## 2017-11-24 DIAGNOSIS — S838X1D Sprain of other specified parts of right knee, subsequent encounter: Secondary | ICD-10-CM

## 2017-11-25 ENCOUNTER — Encounter (INDEPENDENT_AMBULATORY_CARE_PROVIDER_SITE_OTHER): Payer: Self-pay | Admitting: Orthopedic Surgery

## 2017-11-25 NOTE — Progress Notes (Signed)
   Post-Op Visit Note   Patient: Bradley Chapman           Date of Birth: 06/02/1996           MRN: [id number] Visit Date: 11/24/2017 PCP: Merlyn AlbertLuking, William S, MD   Assessment & Plan:  Chief Complaint:  Chief Complaint  Patient presents with  . Right Knee - Routine Post Op   Visit Diagnoses:  1. Injury of meniscus of right knee, subsequent encounter     Plan: Bradley Chapman is now 3 weeks out right knee arthroscopy with partial lateral resection and subsequent repair.  He is doing well.  On exam his knee is stable.  Mild effusion is present.  Range of motion is excellent.  Portal incisions are removed of their sutures.  Plan at this time is for him to work on quad strengthening with stationary bike and return in 6 weeks.  He has flexion easily past 90 degrees.  Follow-Up Instructions: Return in about 4 weeks (around 12/22/2017).   Orders:  No orders of the defined types were placed in this encounter.  No orders of the defined types were placed in this encounter.   Imaging: No results found.  PMFS History: Patient Active Problem List   Diagnosis Date Noted  . Depression 12/21/2014   Past Medical History:  Diagnosis Date  . Anxiety   . GERD (gastroesophageal reflux disease)     Family History  Problem Relation Age of Onset  . Hypertension Father   . Hyperlipidemia Father   . Aneurysm Maternal Grandmother         Aneurysm behind eye  . Aneurysm Maternal Grandfather        Has Aortic Aneurysm  . Heart attack Paternal Grandmother        Died at 6772  . Stroke Paternal Grandfather        Died at 3862    Past Surgical History:  Procedure Laterality Date  . ANTERIOR CRUCIATE LIGAMENT REPAIR Right 10/29/2017   Procedure: RIGHT KNEE ARTHROSCOPY LATERAL MENISCAL REPAIR;  Surgeon: Cammy Copaean, Jaquese Irving Scott, MD;  [facility];  Service: Orthopedics;  Laterality: Right;  . CIRCUMCISION  1999  . TYMPANOSTOMY TUBE PLACEMENT Bilateral 2000, 2001 and 2004  . WISDOM TOOTH EXTRACTION     Social  History   Occupational History  . Occupation: Consulting civil engineerstudent    Comment: [employer]  Tobacco Use  . Smoking status: Never Smoker  . Smokeless tobacco: Never Used  Substance and Sexual Activity  . Alcohol use: No  . Drug use: No  . Sexual activity: No

## 2018-01-05 ENCOUNTER — Ambulatory Visit (INDEPENDENT_AMBULATORY_CARE_PROVIDER_SITE_OTHER): Payer: 59 | Admitting: Orthopedic Surgery

## 2018-01-05 ENCOUNTER — Encounter (INDEPENDENT_AMBULATORY_CARE_PROVIDER_SITE_OTHER): Payer: Self-pay | Admitting: Orthopedic Surgery

## 2018-01-05 DIAGNOSIS — S838X1D Sprain of other specified parts of right knee, subsequent encounter: Secondary | ICD-10-CM

## 2018-01-05 NOTE — Progress Notes (Signed)
   Post-Op Visit Note   Patient: Bradley Chapman           Date of Birth: 12/30/1995           MRN: [id number] Visit Date: 01/05/2018 PCP: Merlyn AlbertLuking, William S, MD   Assessment & Plan:  Chief Complaint:  Chief Complaint  Patient presents with  . Right Knee - Follow-up   Visit Diagnoses:  1. Injury of meniscus of right knee, subsequent encounter     Plan: Bradley Chapman is a 22 year old patient who is now 8 weeks out right knee arthroscopy with partial lateral meniscectomy and remaining horizontal cleft repair.  He has been doing well.  On exam no effusion good range of motion.  I am going to release him at this time.  I want him to take it easy in terms of bent knee loading activity for at least the next 3 months follow-up as needed  Follow-Up Instructions: Return if symptoms worsen or fail to improve.   Orders:  No orders of the defined types were placed in this encounter.  No orders of the defined types were placed in this encounter.   Imaging: No results found.  PMFS History: Patient Active Problem List   Diagnosis Date Noted  . Depression 12/21/2014   Past Medical History:  Diagnosis Date  . Anxiety   . GERD (gastroesophageal reflux disease)     Family History  Problem Relation Age of Onset  . Hypertension Father   . Hyperlipidemia Father   . Aneurysm Maternal Grandmother         Aneurysm behind eye  . Aneurysm Maternal Grandfather        Has Aortic Aneurysm  . Heart attack Paternal Grandmother        Died at 372  . Stroke Paternal Grandfather        Died at 6162    Past Surgical History:  Procedure Laterality Date  . ANTERIOR CRUCIATE LIGAMENT REPAIR Right 10/29/2017   Procedure: RIGHT KNEE ARTHROSCOPY LATERAL MENISCAL REPAIR;  Surgeon: Cammy Copaean, Eiliana Drone Scott, MD;  [facility];  Service: Orthopedics;  Laterality: Right;  . CIRCUMCISION  1999  . TYMPANOSTOMY TUBE PLACEMENT Bilateral 2000, 2001 and 2004  . WISDOM TOOTH EXTRACTION     Social History   Occupational  History  . Occupation: Consulting civil engineerstudent    Comment: [employer]  Tobacco Use  . Smoking status: Never Smoker  . Smokeless tobacco: Never Used  Substance and Sexual Activity  . Alcohol use: No  . Drug use: No  . Sexual activity: No

## 2018-10-27 ENCOUNTER — Encounter: Payer: Self-pay | Admitting: Family Medicine

## 2018-10-27 ENCOUNTER — Ambulatory Visit (INDEPENDENT_AMBULATORY_CARE_PROVIDER_SITE_OTHER): Payer: 59 | Admitting: Family Medicine

## 2018-10-27 VITALS — BP 132/84 | Temp 98.9°F | Ht 73.0 in | Wt 220.0 lb

## 2018-10-27 DIAGNOSIS — H9201 Otalgia, right ear: Secondary | ICD-10-CM

## 2018-10-27 DIAGNOSIS — J029 Acute pharyngitis, unspecified: Secondary | ICD-10-CM

## 2018-10-27 DIAGNOSIS — J019 Acute sinusitis, unspecified: Secondary | ICD-10-CM | POA: Diagnosis not present

## 2018-10-27 LAB — POCT RAPID STREP A (OFFICE): Rapid Strep A Screen: NEGATIVE

## 2018-10-27 MED ORDER — AMOXICILLIN 500 MG PO CAPS: 500.0000 mg | ORAL_CAPSULE | Freq: Three times a day (TID) | ORAL | 0 refills | Status: DC

## 2018-10-27 NOTE — Progress Notes (Signed)
   Subjective:    Patient ID: Bradley Chapman, male    DOB: 10/16/1996, 22 y.o.   MRN: [id number]  HPI  Patient is here today with right ear pain,cough,runny nose,headache,sore throat.Dull pain around right eye socket.Symptoms began on Dec 16,2019. Bradley Chapman has been taking Zycam.  4 days ago started with sore throat, 2 days ago sore throat became worse, sinus pressure, pain around right eye socket, and right ear pain. Reports cough is non-productive, nose productive of light green mucous. Denies fever, no body aches. Mild dull frontal h/a. Feels like symptoms are progressing.    Review of Systems  Constitutional: Negative for chills and fever.  HENT: Positive for congestion, ear pain, postnasal drip, sinus pressure, sinus pain and sore throat.   Respiratory: Positive for cough. Negative for shortness of breath and wheezing.   Gastrointestinal: Negative for abdominal pain, diarrhea, nausea and vomiting.       Objective:   Physical Exam Vitals signs and nursing note reviewed.  Constitutional:      General: Bradley Chapman is not in acute distress.    Appearance: Normal appearance. Bradley Chapman is not toxic-appearing.  HENT:     Head: Normocephalic and atraumatic.     Nose: Congestion present.     Right Sinus: Maxillary sinus tenderness present.     Left Sinus: Maxillary sinus tenderness present.     Mouth/Throat:     Mouth: Mucous membranes are moist.     Pharynx: Oropharyngeal exudate and posterior oropharyngeal erythema present.  Neck:     Musculoskeletal: Neck supple. Muscular tenderness present.  Cardiovascular:     Rate and Rhythm: Normal rate and regular rhythm.     Heart sounds: Normal heart sounds.  Pulmonary:     Effort: Pulmonary effort is normal. No respiratory distress.     Breath sounds: Normal breath sounds. No wheezing, rhonchi or rales.  Lymphadenopathy:     Cervical: No cervical adenopathy.  Skin:    General: Skin is warm and dry.  Neurological:     Mental Status: Bradley Chapman is alert and  oriented to person, place, and time.  Psychiatric:        Mood and Affect: Mood normal.    Results for orders placed or performed in visit on 10/27/18  POCT rapid strep A  Result Value Ref Range   Rapid Strep A Screen Negative Negative        Assessment & Plan:  Sore throat - Plan: Strep A DNA probe, POCT rapid strep A  Right ear pain  Acute rhinosinusitis  Rapid strep test negative, will send back up. Likely post-viral sinusitis setting in will cover with abx, rx sent into pharmacy. Symptomatic care discussed. Warning signs discussed. F/u if symptoms worsen or fail to improve.  Dr. Lubertha SouthSteve Luking was consulted on this case and is in agreement with the above treatment plan.

## 2018-10-28 LAB — STREP A DNA PROBE: STREP GP A DIRECT, DNA PROBE: NEGATIVE

## 2018-10-28 LAB — SPECIMEN STATUS REPORT

## 2019-01-11 IMAGING — MR MR KNEE*R* W/O CM
4 of 6 series · 13 of 40 positions shown · non-contrast
Comparison: Stated 09/16/2017

CLINICAL DATA: Right knee pain after a popping and locking
sensation.

EXAM:
MRI OF THE RIGHT KNEE WITHOUT CONTRAST
TECHNIQUE: Multiplanar, multisequence MR imaging of the knee was performed. No
intravenous contrast was administered.

[Series 4: T1 · coronal · 3.0mm · 0.18mm/px · 4 of 26 slices shown]
[im 1/26]
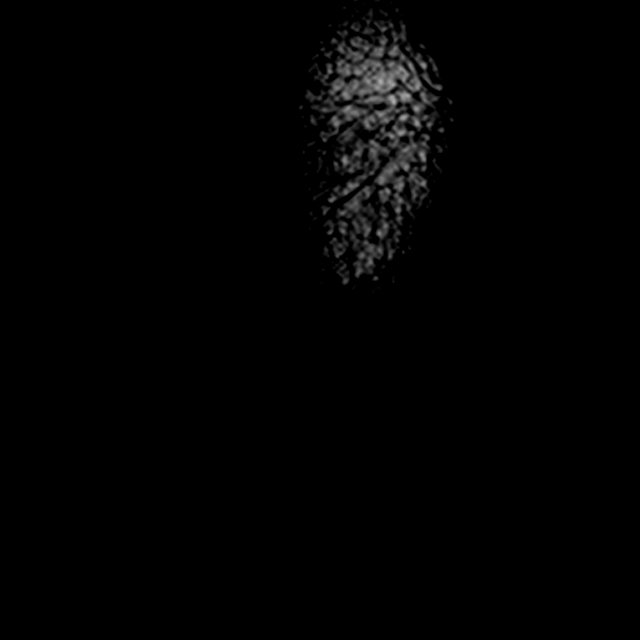
[im 6/26]
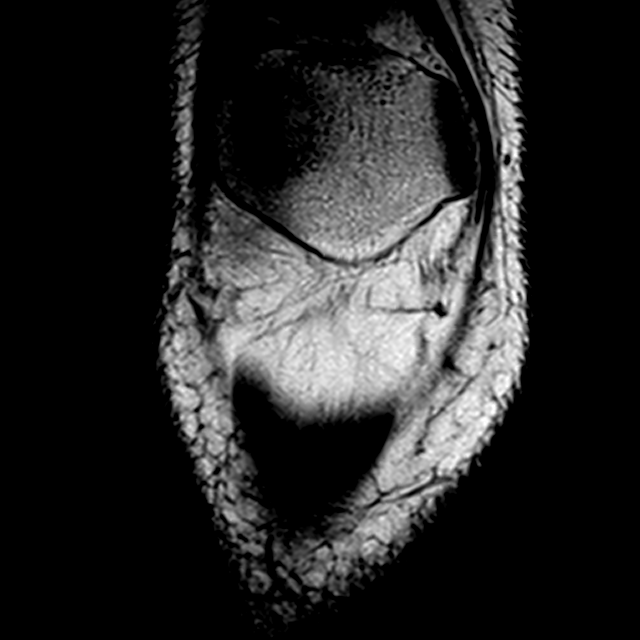
[im 16/26]
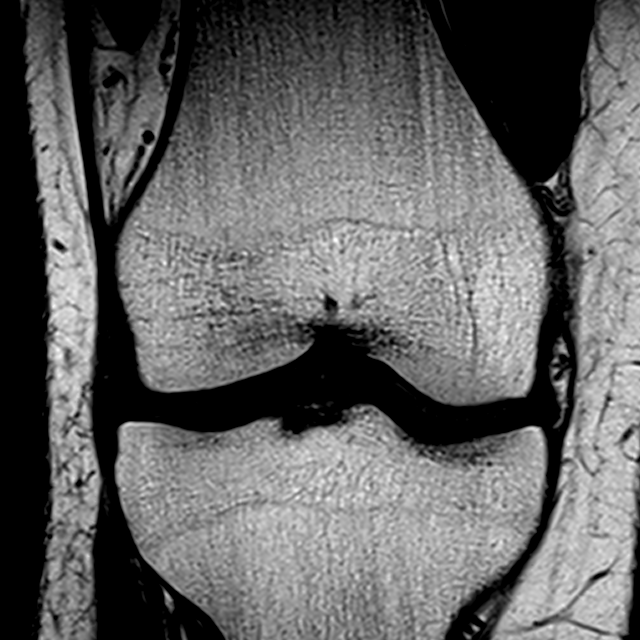
[im 26/26]
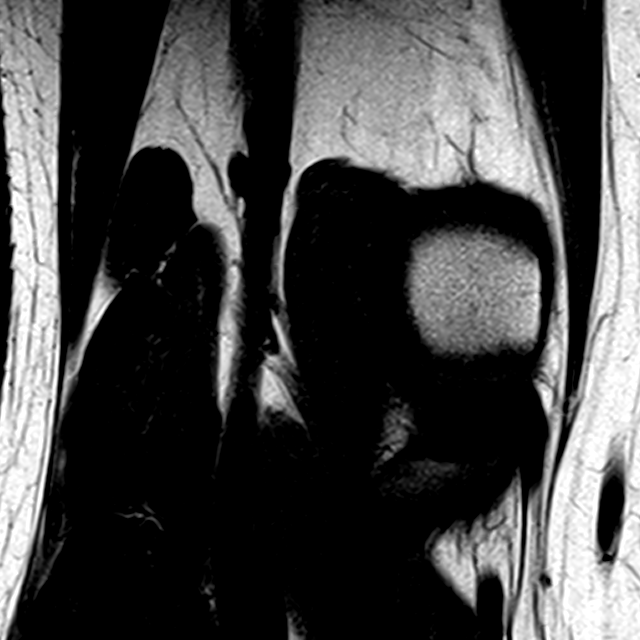

[Series 5: pdfs sag · sagittal · 3.0mm · 0.19mm/px · 3 of 29 slices shown]
[im 5/29]
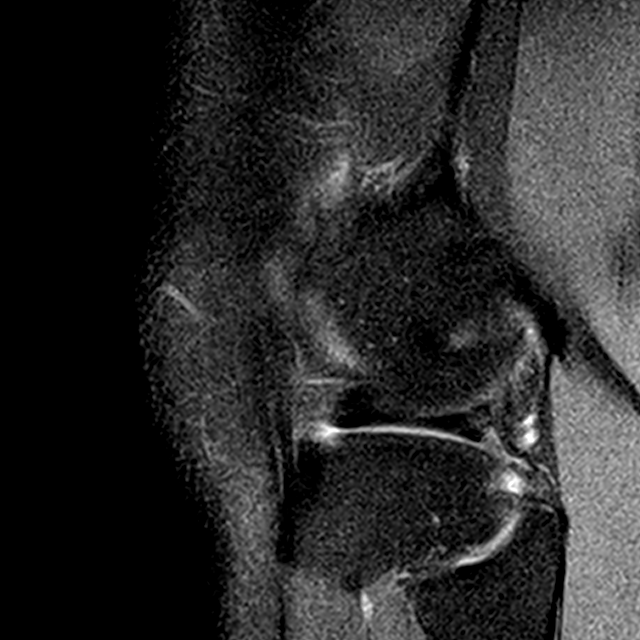
[im 17/29]
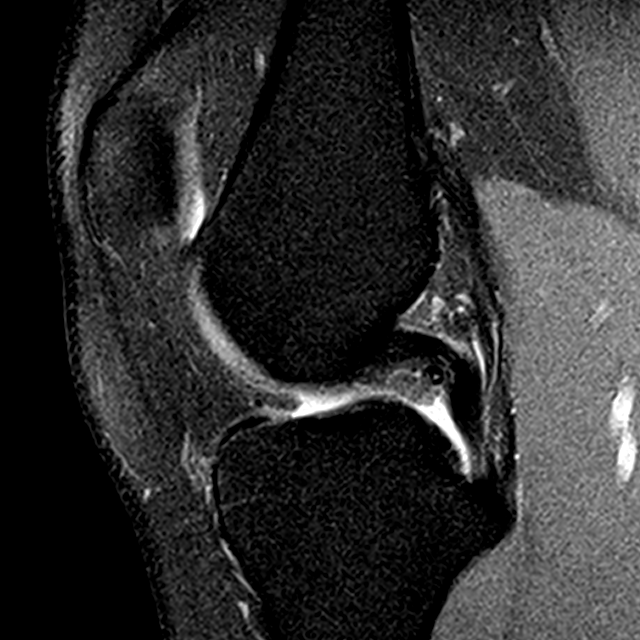
[im 25/29]
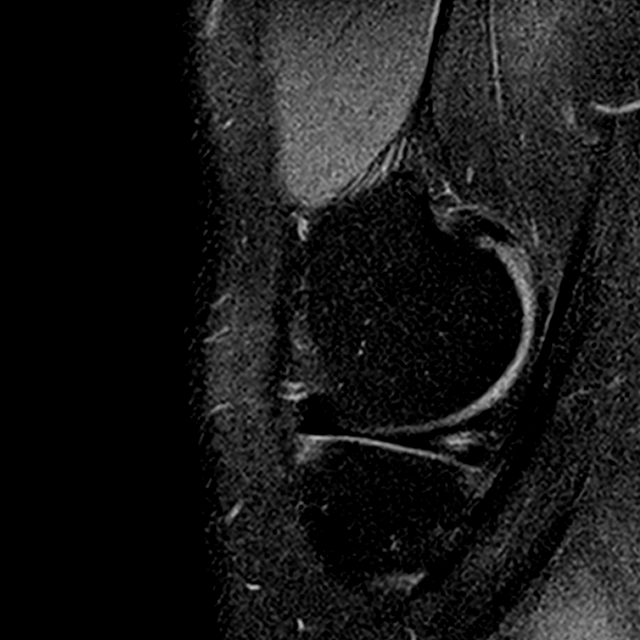

[Series 6: t2fs cor · coronal · 3.0mm · 0.20mm/px · 3 of 26 slices shown]
[im 5/26]
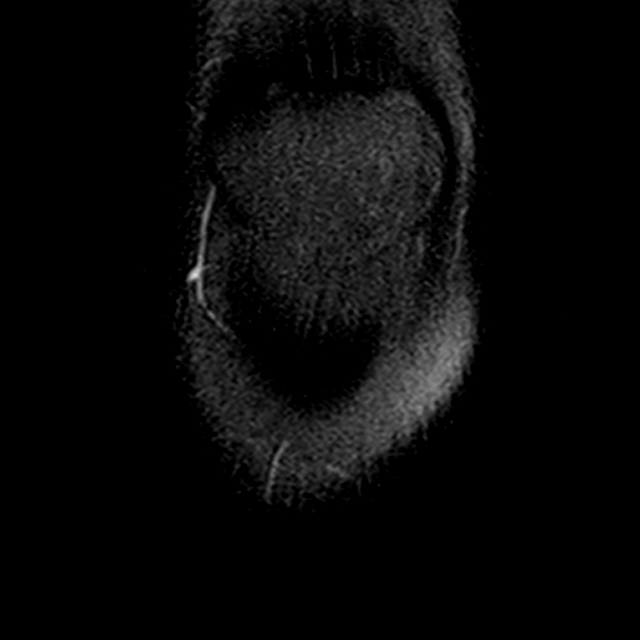
[im 13/26]
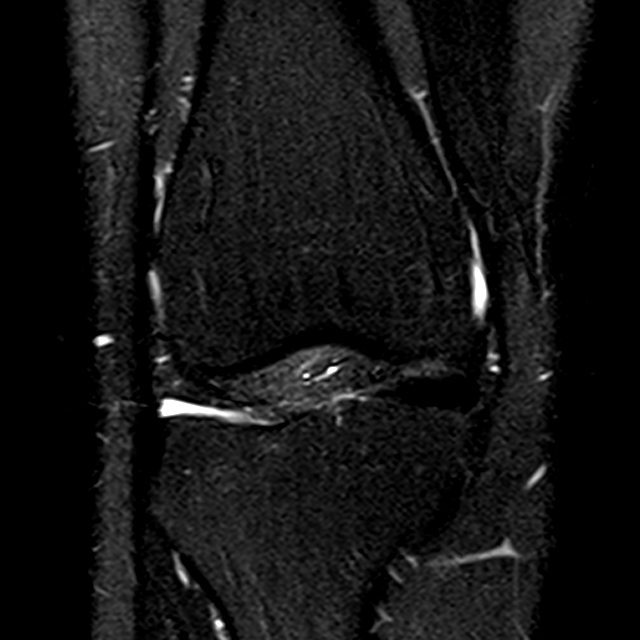
[im 21/26]
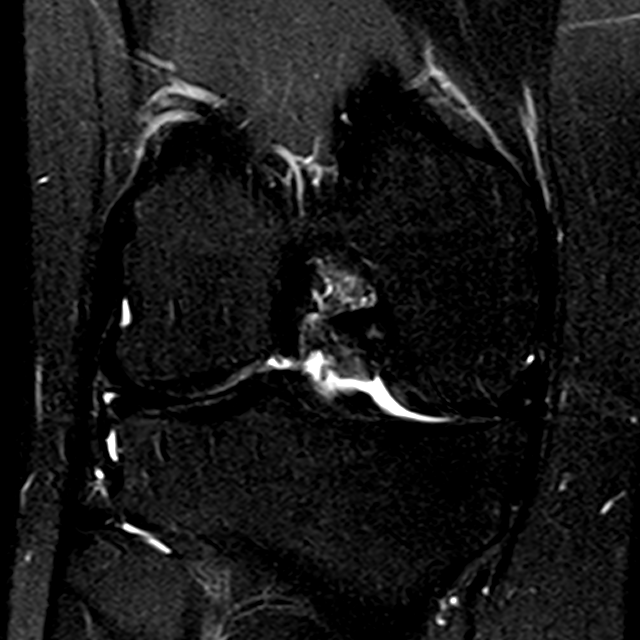

[Series 7: pdfs cor · coronal · 3.0mm · 0.19mm/px · 3 of 26 slices shown]
[im 5/26]
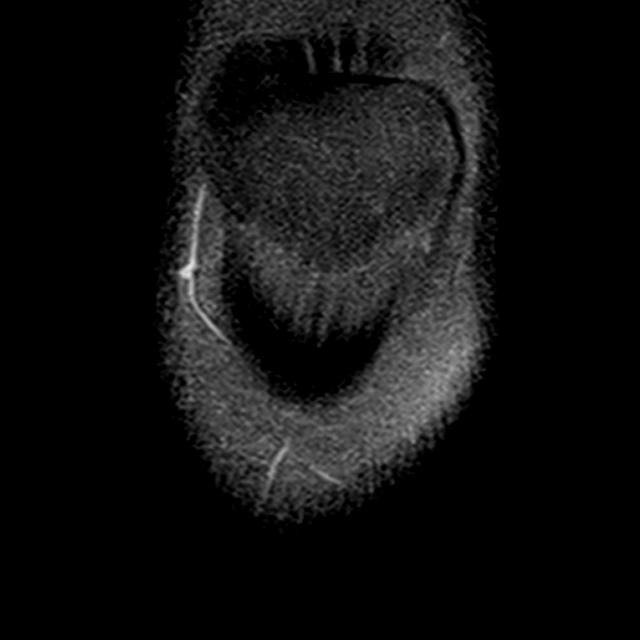
[im 13/26]
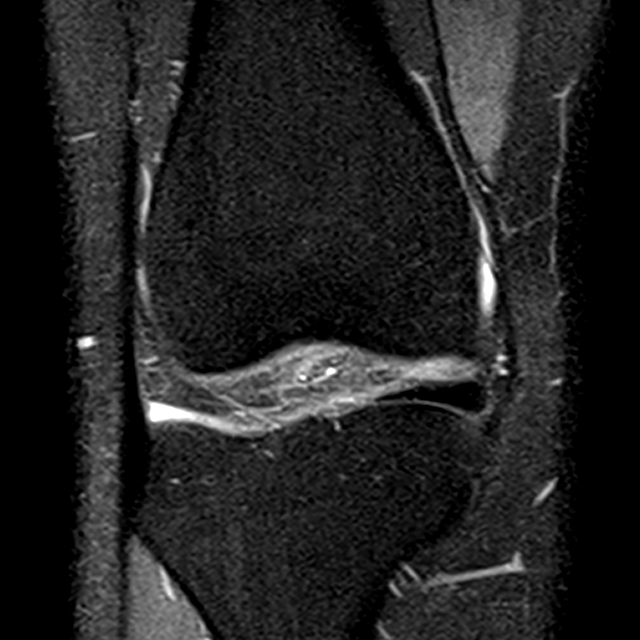
[im 21/26]
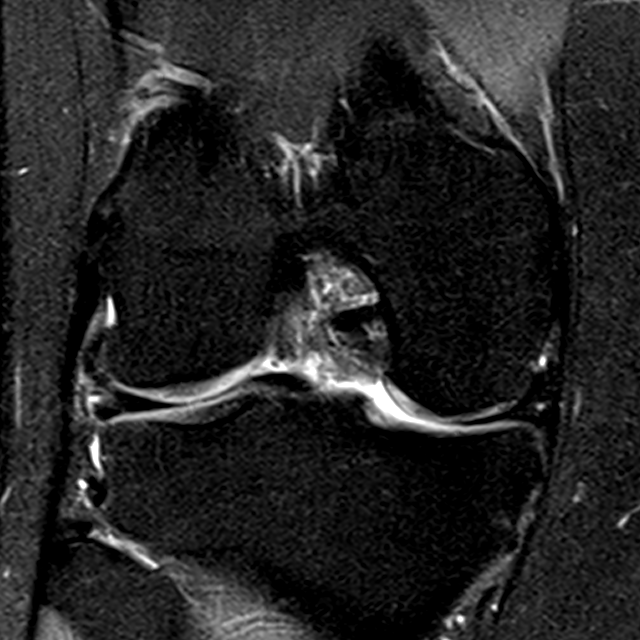

[13 of 40 positions shown; findings below may reference images not displayed]

FINDINGS: MENISCI

Medial meniscus: Notable grade 1 signal in the posterior horn
without surface extension

Lateral meniscus: Grade 3 tear with oblique and vertical components
involving superior and inferior surface.

LIGAMENTS

Cruciates:  Unremarkable

Collaterals:  Unremarkable

CARTILAGE

Patellofemoral:  Unremarkable

Medial:  Unremarkable

Lateral:  Unremarkable

Joint:  Thin medial plica.

Popliteal Fossa:  Unremarkable

Extensor Mechanism: Unremarkable. Tibial tubercle -trochlear groove
distance 1.2 cm.

Bones:  Unremarkable

Other: No supplemental non-categorized findings.
IMPRESSION: 1. Grade 3 oblique in vertical tear of the lateral meniscus
involving superior and inferior meniscal surfaces.
2. Notable degree of grade 1 degenerative signal centrally in the
posterior horn medial meniscus without surface extension.

## 2019-06-05 ENCOUNTER — Other Ambulatory Visit: Payer: Self-pay

## 2019-06-05 ENCOUNTER — Other Ambulatory Visit: Payer: 59

## 2019-06-05 DIAGNOSIS — Z20822 Contact with and (suspected) exposure to covid-19: Secondary | ICD-10-CM

## 2019-06-07 LAB — NOVEL CORONAVIRUS, NAA: SARS-CoV-2, NAA: NOT DETECTED

## 2019-12-06 ENCOUNTER — Encounter: Payer: Self-pay | Admitting: Family Medicine

## 2019-12-07 ENCOUNTER — Encounter: Payer: Self-pay | Admitting: Family Medicine

## 2020-01-18 ENCOUNTER — Ambulatory Visit: Payer: BC Managed Care – PPO | Attending: Internal Medicine

## 2020-01-18 ENCOUNTER — Other Ambulatory Visit: Payer: Self-pay

## 2020-01-18 DIAGNOSIS — Z20822 Contact with and (suspected) exposure to covid-19: Secondary | ICD-10-CM

## 2020-01-19 LAB — NOVEL CORONAVIRUS, NAA: SARS-CoV-2, NAA: NOT DETECTED

## 2022-01-12 ENCOUNTER — Ambulatory Visit (INDEPENDENT_AMBULATORY_CARE_PROVIDER_SITE_OTHER): Payer: BC Managed Care – PPO | Admitting: Family Medicine

## 2022-01-12 ENCOUNTER — Other Ambulatory Visit: Payer: Self-pay

## 2022-01-12 ENCOUNTER — Encounter: Payer: Self-pay | Admitting: Family Medicine

## 2022-01-12 VITALS — BP 131/76 | HR 85 | Temp 97.6°F | Ht 72.5 in | Wt 230.2 lb

## 2022-01-12 DIAGNOSIS — R0683 Snoring: Secondary | ICD-10-CM | POA: Insufficient documentation

## 2022-01-12 DIAGNOSIS — E669 Obesity, unspecified: Secondary | ICD-10-CM

## 2022-01-12 DIAGNOSIS — R5383 Other fatigue: Secondary | ICD-10-CM

## 2022-01-12 DIAGNOSIS — Z130 Encounter for screening for diseases of the blood and blood-forming organs and certain disorders involving the immune mechanism: Secondary | ICD-10-CM

## 2022-01-12 DIAGNOSIS — R0981 Nasal congestion: Secondary | ICD-10-CM | POA: Insufficient documentation

## 2022-01-12 DIAGNOSIS — K219 Gastro-esophageal reflux disease without esophagitis: Secondary | ICD-10-CM | POA: Insufficient documentation

## 2022-01-12 MED ORDER — PANTOPRAZOLE SODIUM 40 MG PO TBEC: 40.0000 mg | DELAYED_RELEASE_TABLET | Freq: Every day | ORAL | 1 refills | Status: AC

## 2022-01-12 MED ORDER — AZELASTINE-FLUTICASONE 137-50 MCG/ACT NA SUSP: 1.0000 | Freq: Two times a day (BID) | NASAL | 0 refills | Status: AC

## 2022-01-12 NOTE — Patient Instructions (Signed)
Medications as prescribed. ? ?You will get a call about the referrals. ? ?Labs today. ? ?Take care ? ?Dr. Lacinda Axon  ?

## 2022-01-12 NOTE — Assessment & Plan Note (Signed)
Placing on Dymista.  Referring to ENT. ?

## 2022-01-12 NOTE — Progress Notes (Addendum)
? ?Subjective:  ?Patient ID: Bradley Chapman, male    DOB: [birth year]  Age: 26 y.o. MRN: [id number] ? ?CC: ?Chief Complaint  ?Patient presents with  ? Establish Care  ?  Snoring, constantly tired ?Constantly congested and runny nose ?Right eye blurry this morning.  ? ? ?HPI: ? ?26 year old male presents to establish care.  He is accompanied by his wife today. ? ?Patient endorses ongoing fatigue.  This appears to be chronic.  He and his wife have a young child at home.  Wife states that he snores.  Concern for OSA.  He states that he sleeps fairly well.  No reports of sleeping during the day while he is at work. ? ?He also reports chronic nasal congestion.  He states that this has been going on for years.  He has used over-the-counter antihistamines, decongestants, Mucinex, and nasal steroids without improvement.  Seems to be worse in the spring.  However, this bothers him nearly year-round.  He does have some associated rhinorrhea. ? ?Patient also endorses issues with GERD.  This is chronic.  He uses Pepcid as needed. ? ?Patient Active Problem List  ? Diagnosis Date Noted  ? Fatigue 01/12/2022  ? Snoring 01/12/2022  ? Chronic nasal congestion 01/12/2022  ? Obesity (BMI 30-39.9) 01/12/2022  ? GERD (gastroesophageal reflux disease) 01/12/2022  ? ? ?Social Hx   ?Social History  ? ?Socioeconomic History  ? Marital status: Married  ?  Spouse name: n/a  ? Number of children: 0  ? Years of education: Not on file  ? Highest education level: Not on file  ?Occupational History  ? Occupation: student  ?  Comment: [employer]  ?Tobacco Use  ? Smoking status: Never  ? Smokeless tobacco: Never  ?Vaping Use  ? Vaping Use: Never used  ?Substance and Sexual Activity  ? Alcohol use: No  ? Drug use: No  ? Sexual activity: Never  ?Other Topics Concern  ? Not on file  ?Social History Narrative  ? Lives with both parents in the same household.  ? Unsure what he wants to do in the future, but plans to attend college.   ? ?Social Determinants of Health  ? ?Financial Resource Strain: Not on file  ?Food Insecurity: Not on file  ?Transportation Needs: Not on file  ?Physical Activity: Not on file  ?Stress: Not on file  ?Social Connections: Not on file  ? ? ?Review of Systems ?Per HPI ? ?Objective:  ?BP 131/76   Pulse 85   Temp 97.6 ?F (36.4 ?C)   Ht 6' 0.5" (1.842 m)   Wt 230 lb 3.2 oz (104.4 kg)   SpO2 95%   BMI 30.79 kg/m?  ? ?BP/Weight 01/12/2022 10/27/2018 10/29/2017  ?Systolic BP 116 579 038  ?Diastolic BP 76 84 64  ?Wt. (Lbs) 230.2 220 220  ?BMI 30.79 29.03 28.25  ? ? ?Physical Exam ?Vitals and nursing note reviewed.  ?Constitutional:   ?   General: He is not in acute distress. ?   Appearance: Normal appearance.  ?HENT:  ?   Head: Normocephalic and atraumatic.  ?   Ears:  ?   Comments: Left TM with perforation. ?Right TM obscured by cerumen. ?   Nose: Congestion present.  ?Eyes:  ?   General:     ?   Right eye: No discharge.     ?   Left eye: No discharge.  ?   Conjunctiva/sclera: Conjunctivae normal.  ?Cardiovascular:  ?   Rate and  Rhythm: Normal rate and regular rhythm.  ?Pulmonary:  ?   Effort: Pulmonary effort is normal.  ?   Breath sounds: Normal breath sounds.  ?Neurological:  ?   Mental Status: He is alert.  ?Psychiatric:     ?   Mood and Affect: Mood normal.     ?   Behavior: Behavior normal.  ? ? ?Lab Results  ?Component Value Date  ? WBC 6.6 10/29/2017  ? HGB 15.5 10/29/2017  ? HCT 45.6 10/29/2017  ? PLT 245 10/29/2017  ? TSH 0.710 09/21/2014  ? ? ? ?Assessment & Plan:  ? ?Problem List Items Addressed This Visit   ? ?  ? Digestive  ? GERD (gastroesophageal reflux disease)  ?  Starting on Protonix. ?  ?  ? Relevant Medications  ? pantoprazole (PROTONIX) 40 MG tablet  ?  ? Other  ? Fatigue  ? Relevant Orders  ? Ambulatory referral to Sleep Studies  ? Snoring - Primary  ?  Arranging sleep study. ?  ?  ? Relevant Orders  ? Ambulatory referral to Sleep Studies  ? Chronic nasal congestion  ?  Placing on Dymista.   Referring to ENT. ?  ?  ? Relevant Orders  ? Ambulatory referral to ENT  ? Obesity (BMI 30-39.9)  ? Relevant Orders  ? CMP14+EGFR  ? Lipid panel  ? Hemoglobin A1c  ? ?Other Visit Diagnoses   ? ? Screening for deficiency anemia      ? Relevant Orders  ? CBC  ? ?  ? ? ?Meds ordered this encounter  ?Medications  ? pantoprazole (PROTONIX) 40 MG tablet  ?  Sig: Take 1 tablet (40 mg total) by mouth daily.  ?  Dispense:  90 tablet  ?  Refill:  1  ? Azelastine-Fluticasone 137-50 MCG/ACT SUSP  ?  Sig: Place 1 spray into the nose every 12 (twelve) hours.  ?  Dispense:  23 g  ?  Refill:  0  ? ?Thersa Salt DO ?[place] ? ?

## 2022-01-12 NOTE — Assessment & Plan Note (Signed)
Arranging sleep study. ?

## 2022-01-12 NOTE — Assessment & Plan Note (Signed)
Starting on Protonix. 

## 2022-01-13 ENCOUNTER — Ambulatory Visit: Payer: 59 | Admitting: Family Medicine

## 2022-01-13 LAB — CMP14+EGFR
ALT: 59 IU/L — ABNORMAL HIGH (ref 0–44)
AST: 29 IU/L (ref 0–40)
Albumin/Globulin Ratio: 1.6 (ref 1.2–2.2)
Albumin: 4.9 g/dL (ref 4.1–5.2)
Alkaline Phosphatase: 74 IU/L (ref 44–121)
BUN/Creatinine Ratio: 11 (ref 9–20)
BUN: 13 mg/dL (ref 6–20)
Bilirubin Total: 0.5 mg/dL (ref 0.0–1.2)
CO2: 26 mmol/L (ref 20–29)
Calcium: 10.4 mg/dL — ABNORMAL HIGH (ref 8.7–10.2)
Chloride: 100 mmol/L (ref 96–106)
Creatinine, Ser: 1.17 mg/dL (ref 0.76–1.27)
Globulin, Total: 3 g/dL (ref 1.5–4.5)
Glucose: 90 mg/dL (ref 70–99)
Potassium: 4.9 mmol/L (ref 3.5–5.2)
Sodium: 138 mmol/L (ref 134–144)
Total Protein: 7.9 g/dL (ref 6.0–8.5)
eGFR: 89 mL/min/{1.73_m2} (ref >59)

## 2022-01-13 LAB — LIPID PANEL
Chol/HDL Ratio: 8.1 ratio — ABNORMAL HIGH (ref 0.0–5.0)
Cholesterol, Total: 251 mg/dL — ABNORMAL HIGH (ref 100–199)
HDL: 31 mg/dL — ABNORMAL LOW (ref >39)
LDL Chol Calc (NIH): 170 mg/dL — ABNORMAL HIGH (ref 0–99)
Triglycerides: 264 mg/dL — ABNORMAL HIGH (ref 0–149)
VLDL Cholesterol Cal: 50 mg/dL — ABNORMAL HIGH (ref 5–40)

## 2022-01-13 LAB — CBC
Hematocrit: 47.1 % (ref 37.5–51.0)
Hemoglobin: 15.8 g/dL (ref 13.0–17.7)
MCH: 30.2 pg (ref 26.6–33.0)
MCHC: 33.5 g/dL (ref 31.5–35.7)
MCV: 90 fL (ref 79–97)
Platelets: 267 10*3/uL (ref 150–450)
RBC: 5.24 x10E6/uL (ref 4.14–5.80)
RDW: 12.5 % (ref 11.6–15.4)
WBC: 7 10*3/uL (ref 3.4–10.8)

## 2022-01-13 LAB — HEMOGLOBIN A1C
Est. average glucose Bld gHb Est-mCnc: 103 mg/dL
Hgb A1c MFr Bld: 5.2 % (ref 4.8–5.6)

## 2022-01-13 NOTE — Addendum Note (Signed)
Addended by: Margaretha Sheffield on: 01/13/2022 02:56 PM ? ? Modules accepted: Orders ? ?

## 2022-03-09 ENCOUNTER — Encounter: Payer: Self-pay | Admitting: Neurology

## 2022-03-09 ENCOUNTER — Ambulatory Visit: Payer: BC Managed Care – PPO | Admitting: Neurology

## 2022-03-09 VITALS — BP 120/69 | HR 85 | Ht 74.0 in | Wt 235.0 lb

## 2022-03-09 DIAGNOSIS — G4719 Other hypersomnia: Secondary | ICD-10-CM | POA: Diagnosis not present

## 2022-03-09 DIAGNOSIS — R0681 Apnea, not elsewhere classified: Secondary | ICD-10-CM

## 2022-03-09 DIAGNOSIS — E66811 Obesity, class 1: Secondary | ICD-10-CM

## 2022-03-09 DIAGNOSIS — E669 Obesity, unspecified: Secondary | ICD-10-CM | POA: Diagnosis not present

## 2022-03-09 DIAGNOSIS — Z9189 Other specified personal risk factors, not elsewhere classified: Secondary | ICD-10-CM

## 2022-03-09 DIAGNOSIS — R0683 Snoring: Secondary | ICD-10-CM | POA: Diagnosis not present

## 2022-03-09 NOTE — Patient Instructions (Signed)

## 2022-03-09 NOTE — Progress Notes (Signed)
Subjective:  ?  ?Patient ID: Bradley Chapman is a 26 y.o. male. ? ?HPI ? ? ? ?Bradley FoleySaima Tonji Elliff, MD, PhD ?[facility] ?[address], [address] ?[phone] ?[place], [place] [id number] ? ?Dear Dr. Adriana Chapman,  ? ?I saw your patient, Bradley Chapman, upon your kind request in my sleep clinic today for initial consultation of his sleep disorder, in particular, concern for underlying obstructive sleep apnea.  The patient is accompanied by his wife today.  As you know, Bradley Chapman is a 26 year old right-handed gentleman with an underlying medical history of reflux disease, anxiety, allergies and mild obesity, who reports snoring and excessive daytime somnolence.  His wife has noted pauses in his breathing while he is asleep and she estimates that his sleepiness is worse than he indicated.  His fatigue score was 29 out of 63 and by his estimate his Epworth sleepiness score was 5 out of 24, she corrected it to 11 out of 24 by her estimation.  He admits to not always waking up rested and needing a nap.  They have a 147-month-old daughter who sleeps in the bedroom with them in the bassinet.  She is a good sleeper.  His bedtime is generally between 9:30 PM and 11 PM and rise time between 5 AM and 6:30 AM.  He works [employer].  He has 1 cat and 1 dog in the household, he denies recurrent morning headaches or night to night nocturia, he is not aware of any family history of sleep apnea.  He does have nasal congestion and symptoms in keeping with allergies.  He drinks caffeine in the form of soda, about 2/day and 1 serving of tea.  He is a non-smoker and drinks alcohol very occasionally, maybe twice a month. ? ? ?His Past Medical History Is Significant For: ?Past Medical History:  ?Diagnosis Date  ? Anxiety   ? GERD (gastroesophageal reflux disease)   ? ? ?His Past Surgical History Is Significant For: ?Past Surgical History:  ?Procedure Laterality Date  ? ANTERIOR CRUCIATE LIGAMENT REPAIR Right 10/29/2017  ? Procedure:  RIGHT KNEE ARTHROSCOPY LATERAL MENISCAL REPAIR;  Surgeon: Cammy Copaean, Gregory Scott, MD;  Location: [facility] OR;  Service: Orthopedics;  Laterality: Right;  ? CIRCUMCISION  1999  ? TYMPANOSTOMY TUBE PLACEMENT Bilateral 2000, 2001 and 2004  ? WISDOM TOOTH EXTRACTION    ? ? ?His Family History Is Significant For: ?Family History  ?Problem Relation Age of Onset  ? Hypertension Father   ? Hyperlipidemia Father   ? Snoring Father   ? Aneurysm Maternal Grandmother   ?      Aneurysm behind eye  ? Aneurysm Maternal Grandfather   ?     Has Aortic Aneurysm  ? Heart attack Paternal Grandmother   ?     Died at 5772  ? Stroke Paternal Grandfather   ?     Died at 6162  ? ? ?His Social History Is Significant For: ?Social History  ? ?Socioeconomic History  ? Marital status: Married  ?  Spouse name: n/a  ? Number of children: 0  ? Years of education: Not on file  ? Highest education level: Not on file  ?Occupational History  ? Occupation: student  ?  Comment: [employer]  ?Tobacco Use  ? Smoking status: Never  ? Smokeless tobacco: Never  ?Vaping Use  ? Vaping Use: Never used  ?Substance and Sexual Activity  ? Alcohol use: Yes  ?  Comment: every once in awhile  ?  Drug use: No  ? Sexual activity: Never  ?Other Topics Concern  ? Not on file  ?Social History Narrative  ? Lives with both parents in the same household.  ? Unsure what he wants to do in the future, but plans to attend college.  ?   ? Update 03/09/2022  ? Lives at home with wife and daughter  ? Right handed  ? Caffeine: about 5 cups/day  ? ?Social Determinants of Health  ? ?Financial Resource Strain: Not on file  ?Food Insecurity: Not on file  ?Transportation Needs: Not on file  ?Physical Activity: Not on file  ?Stress: Not on file  ?Social Connections: Not on file  ? ? ?His Allergies Are:  ?No Known Allergies:  ? ?His Current Medications Are:  ?Outpatient Encounter Medications as of 03/09/2022  ?Medication Sig  ? Azelastine-Fluticasone 137-50 MCG/ACT SUSP Place 1 spray into  the nose every 12 (twelve) hours.  ? famotidine-calcium carbonate-magnesium hydroxide (PEPCID COMPLETE) 10-800-165 MG chewable tablet Chew 1 tablet by mouth daily as needed (for heartburn).  ? pantoprazole (PROTONIX) 40 MG tablet Take 1 tablet (40 mg total) by mouth daily.  ? ?No facility-administered encounter medications on file as of 03/09/2022.  ?: ? ? ?Review of Systems:  ?Out of a complete 14 point review of systems, all are reviewed and negative with the exception of these symptoms as listed below: ? ?Review of Systems  ?Neurological:   ?     Patient is here with his wife for sleep consult. Patient's wife states he snores like freight train. She states he will snore, then stop, then start again and she wonders if he's breathing. It's much worse on his back than on his side. Pt states he has never had a sleep study. ESS 5 FSS 29.  ?Wife corrected ESS to 11 out of 24. ? ?Objective:  ?Neurological Exam ? ?Physical Exam ?Physical Examination:  ? ?Vitals:  ? 03/09/22 1356  ?BP: 120/69  ?Pulse: 85  ? ? ?General Examination: The patient is a very pleasant 26 y.o. male in no acute distress. He appears well-developed and well-nourished and well groomed.  ? ?HEENT: Normocephalic, atraumatic, pupils are equal, round and reactive to light, extraocular tracking is good without limitation to gaze excursion or nystagmus noted. Hearing is grossly intact. Face is symmetric with normal facial animation. Speech is clear with no dysarthria noted. There is no hypophonia. There is no lip, neck/head, jaw or voice tremor. Neck is supple with full range of passive and active motion. There are no carotid bruits on auscultation. Oropharynx exam reveals: mild mouth dryness, adequate dental hygiene and moderate airway crowding, due to small airway entry, tonsillar size of 1-2+ on the right and 1+ on the left, prominent uvula.  Mallampati class II, neck circumference of 16-1/4 inches.  He has a minimal overbite.  Tongue protrudes centrally  and palate elevates symmetrically.  Nasal inspection reveals no significant deviated septum or mucosal swelling. ? ?Chest: Clear to auscultation without wheezing, rhonchi or crackles noted. ? ?Heart: S1+S2+0, regular and normal without murmurs, rubs or gallops noted.  ? ?Abdomen: Soft, non-tender and non-distended. ? ?Extremities: There is no pitting edema in the distal lower extremities bilaterally.  ? ?Skin: Warm and dry without trophic changes noted.  ? ?Musculoskeletal: exam reveals no obvious joint deformities.  ? ?Neurologically:  ?Mental status: The patient is awake, alert and oriented in all 4 spheres. His immediate and remote memory, attention, language skills and fund of knowledge are appropriate. There  is no evidence of aphasia, agnosia, apraxia or anomia. Speech is clear with normal prosody and enunciation. Thought process is linear. Mood is normal and affect is normal.  ?Cranial nerves II - XII are as described above under HEENT exam.  ?Motor exam: Normal bulk, strength and tone is noted. There is no obvious tremor. Fine motor skills and coordination: grossly intact.  ?Cerebellar testing: No dysmetria or intention tremor. There is no truncal or gait ataxia.  ?Sensory exam: intact to light touch in the upper and lower extremities.  ?Gait, station and balance: He stands easily. No veering to one side is noted. No leaning to one side is noted. Posture is age-appropriate and stance is narrow based. Gait shows normal stride length and normal pace. No problems turning are noted.  ? ?Assessment and Plan:  ? ?In summary, Bradley Chapman is a very pleasant 26 y.o.-year old male with a history and physical exam concerning for obstructive sleep apnea (OSA). ?I had a long chat with the patient and his wife about my findings and the diagnosis of OSA, its prognosis and treatment options. We talked about medical treatments, surgical interventions and non-pharmacological approaches. I explained in particular the risks  and ramifications of untreated moderate to severe OSA, especially with respect to developing cardiovascular disease down the Road, including congestive heart failure, difficult to treat hypertension, car

## 2022-03-17 DIAGNOSIS — R0683 Snoring: Secondary | ICD-10-CM | POA: Diagnosis not present

## 2022-03-17 DIAGNOSIS — J342 Deviated nasal septum: Secondary | ICD-10-CM | POA: Diagnosis not present

## 2022-03-17 DIAGNOSIS — J309 Allergic rhinitis, unspecified: Secondary | ICD-10-CM | POA: Diagnosis not present

## 2022-03-17 DIAGNOSIS — R0681 Apnea, not elsewhere classified: Secondary | ICD-10-CM | POA: Diagnosis not present

## 2022-04-10 ENCOUNTER — Ambulatory Visit
Admission: EM | Admit: 2022-04-10 | Discharge: 2022-04-10 | Disposition: A | Discharge status: Home / Self Care | Payer: BC Managed Care – PPO | Attending: Family Medicine | Admitting: Family Medicine

## 2022-04-10 ENCOUNTER — Encounter: Payer: Self-pay | Admitting: Emergency Medicine

## 2022-04-10 DIAGNOSIS — J0100 Acute maxillary sinusitis, unspecified: Secondary | ICD-10-CM

## 2022-04-10 LAB — POCT RAPID STREP A (OFFICE): Rapid Strep A Screen: NEGATIVE

## 2022-04-10 MED ORDER — AMOXICILLIN-POT CLAVULANATE 875-125 MG PO TABS: 1.0000 | ORAL_TABLET | Freq: Two times a day (BID) | ORAL | 0 refills | Status: DC

## 2022-04-10 NOTE — ED Provider Notes (Signed)
[facility]    CSN: [id number] Arrival date & time: 04/10/22  0904      History   Chief Complaint Chief Complaint  Patient presents with   Nasal Congestion    Cough, brown/bloody mucus, nasal congestion, fatigue. - Entered by patient    HPI Bradley Chapman is a 26 y.o. male.   Presenting today with over a week of progressively worsening nasal congestion, facial pain and pressure, thick brown drainage and sputum, productive cough, sore swollen feeling throat, left ear pain.  Denies fever, chills, chest pain, shortness of breath, abdominal pain, nausea vomiting or diarrhea.  Taking nasal sprays, Mucinex, TheraFlu with no relief.  No known sick contacts recently.   Past Medical History:  Diagnosis Date   Anxiety    GERD (gastroesophageal reflux disease)     Patient Active Problem List   Diagnosis Date Noted   Fatigue 01/12/2022   Snoring 01/12/2022   Chronic nasal congestion 01/12/2022   Obesity (BMI 30-39.9) 01/12/2022   GERD (gastroesophageal reflux disease) 01/12/2022    Past Surgical History:  Procedure Laterality Date   ANTERIOR CRUCIATE LIGAMENT REPAIR Right 10/29/2017   Procedure: RIGHT KNEE ARTHROSCOPY LATERAL MENISCAL REPAIR;  Surgeon: Cammy Copa, MD;  Location: [facility] OR;  Service: Orthopedics;  Laterality: Right;   CIRCUMCISION  1999   TYMPANOSTOMY TUBE PLACEMENT Bilateral 2000, 2001 and 2004   WISDOM TOOTH EXTRACTION         Home Medications    Prior to Admission medications   Medication Sig Start Date End Date Taking? Authorizing Provider  amoxicillin-clavulanate (AUGMENTIN) 875-125 MG tablet Take 1 tablet by mouth every 12 (twelve) hours. 04/10/22  Yes Particia Nearing, PA-C  Azelastine-Fluticasone 137-50 MCG/ACT SUSP Place 1 spray into the nose every 12 (twelve) hours. 01/12/22   Tommie Sams, DO  famotidine-calcium carbonate-magnesium hydroxide (PEPCID COMPLETE) 10-800-165 MG chewable tablet Chew 1 tablet by mouth daily as needed  (for heartburn).    [provider]  pantoprazole (PROTONIX) 40 MG tablet Take 1 tablet (40 mg total) by mouth daily. 01/12/22   Tommie Sams, DO    Family History Family History  Problem Relation Age of Onset   Hypertension Father    Hyperlipidemia Father    Snoring Father    Aneurysm Maternal Grandmother         Aneurysm behind eye   Aneurysm Maternal Grandfather        Has Aortic Aneurysm   Heart attack Paternal Grandmother        Died at 22   Stroke Paternal Grandfather        Died at 89    Social History Social History   Tobacco Use   Smoking status: Never   Smokeless tobacco: Never  Vaping Use   Vaping Use: Never used  Substance Use Topics   Alcohol use: Yes    Comment: every once in awhile   Drug use: No     Allergies   Patient has no known allergies.   Review of Systems Review of Systems Per HPI  Physical Exam Triage Vital Signs ED Triage Vitals  Enc Vitals Group     BP 04/10/22 0917 132/86     Pulse Rate 04/10/22 0917 76     Resp 04/10/22 0917 18     Temp 04/10/22 0917 98.6 F (37 C)     Temp Source 04/10/22 0917 Oral     SpO2 04/10/22 0917 95 %     Weight --  Height --      Head Circumference --      Peak Flow --      Pain Score 04/10/22 0918 1     Pain Loc --      Pain Edu? --      Excl. in GC? --    No data found.  Updated Vital Signs BP 132/86 (BP Location: Right Arm)   Pulse 76   Temp 98.6 F (37 C) (Oral)   Resp 18   SpO2 95%   Visual Acuity Right Eye Distance:   Left Eye Distance:   Bilateral Distance:    Right Eye Near:   Left Eye Near:    Bilateral Near:     Physical Exam Vitals and nursing note reviewed.  Constitutional:      Appearance: He is well-developed.  HENT:     Head: Atraumatic.     Right Ear: External ear normal.     Left Ear: External ear normal.     Ears:     Comments: Bilateral middle ear effusions    Nose: Congestion present.     Mouth/Throat:     Mouth: Mucous membranes are  moist.     Pharynx: Posterior oropharyngeal erythema present. No oropharyngeal exudate.  Eyes:     Conjunctiva/sclera: Conjunctivae normal.     Pupils: Pupils are equal, round, and reactive to light.  Cardiovascular:     Rate and Rhythm: Normal rate and regular rhythm.  Pulmonary:     Effort: Pulmonary effort is normal. No respiratory distress.     Breath sounds: No wheezing or rales.  Musculoskeletal:        General: Normal range of motion.     Cervical back: Normal range of motion and neck supple.  Lymphadenopathy:     Cervical: No cervical adenopathy.  Skin:    General: Skin is warm and dry.  Neurological:     Mental Status: He is alert and oriented to person, place, and time.  Psychiatric:        Behavior: Behavior normal.     UC Treatments / Results  Labs (all labs ordered are listed, but only abnormal results are displayed) Labs Reviewed  POCT RAPID STREP A (OFFICE)    EKG   Radiology No results found.  Procedures Procedures (including critical care time)  Medications Ordered in UC Medications - No data to display  Initial Impression / Assessment and Plan / UC Course  I have reviewed the triage vital signs and the nursing notes.  Pertinent labs & imaging results that were available during my care of the patient were reviewed by me and considered in my medical decision making (see chart for details).     Vitals benign and reassuring, rapid strep negative.  Suspect sinus infection.  Treat with Augmentin, sinus rinses, nasal sprays, allergy regimen, supportive care.  Return for worsening symptoms.  Final Clinical Impressions(s) / UC Diagnoses   Final diagnoses:  Acute non-recurrent maxillary sinusitis   Discharge Instructions   None    ED Prescriptions     Medication Sig Dispense Auth. Provider   amoxicillin-clavulanate (AUGMENTIN) 875-125 MG tablet Take 1 tablet by mouth every 12 (twelve) hours. 14 tablet Particia Nearing, [place]      PDMP  not reviewed this encounter.   Roosvelt Maser [place], [place] 04/10/22 [phone]

## 2022-04-10 NOTE — ED Triage Notes (Signed)
Nasal congestion that started on Saturday.  Sore throat on Monday.  Pressure in both ears.  Has been taking mucinex with some relief.  Coughing up brown sputum.

## 2022-04-28 ENCOUNTER — Telehealth: Payer: Self-pay | Admitting: Neurology

## 2022-04-28 NOTE — Telephone Encounter (Signed)
04/28/22 unable to leave vmail EE  04/15/22 [employer] no auth req ref # Melissa F on 04/15/22 EE VM full 04/20/22 [place]

## 2022-10-05 ENCOUNTER — Ambulatory Visit
Admission: EM | Admit: 2022-10-05 | Discharge: 2022-10-05 | Disposition: A | Discharge status: Home / Self Care | Payer: Self-pay | Attending: Nurse Practitioner | Admitting: Nurse Practitioner

## 2022-10-05 ENCOUNTER — Ambulatory Visit: Payer: Self-pay

## 2022-10-05 DIAGNOSIS — L02214 Cutaneous abscess of groin: Secondary | ICD-10-CM | POA: Diagnosis not present

## 2022-10-05 MED ORDER — DOXYCYCLINE HYCLATE 100 MG PO CAPS: 100.0000 mg | ORAL_CAPSULE | Freq: Two times a day (BID) | ORAL | 0 refills | Status: AC

## 2022-10-05 NOTE — ED Triage Notes (Signed)
Possible spider bite from 3 days ago. On back on right thigh close to groin. Thinks a spider or insect could have bit him or ingrown hair. Noticed it has gotten more swollen and red since yesterday. Patient also states he had a headache Saturday then it went away. Today has a mild headache, also has 2 ulcer on tongue didn't know if it could be related.

## 2022-10-05 NOTE — ED Provider Notes (Signed)
[facility]    CSN: [id number] Arrival date & time: 10/05/22  1018      History   Chief Complaint Chief Complaint  Patient presents with   Insect Bite   Headache    HPI Bradley Chapman is a 26 y.o. male.   Patient presents for painful, red, and inflamed skin in between his groin and thigh on the left side that has been present for the past couple of days.  Reports he noticed the area was painful while at work over the weekend.  Currently, the pain is much improved and is now only when it is rubbing on a piece of clothing.  He reports the area was red and swollen more so yesterday and is now somewhat improved.  Reports his wife looked at it yesterday and said there is in the yellow coming out of it, although he did not notice any drainage on his clothing.  He denies fevers, vomiting at home.  Reports he has a low-grade fever in urgent care today and did not know about that.  Also reports he felt nauseous momentarily on Saturday after putting his child to bed.  Reports he has been cleaning the area with soap and water without much improvement.    Past Medical History:  Diagnosis Date   Anxiety    GERD (gastroesophageal reflux disease)     Patient Active Problem List   Diagnosis Date Noted   Fatigue 01/12/2022   Snoring 01/12/2022   Chronic nasal congestion 01/12/2022   Obesity (BMI 30-39.9) 01/12/2022   GERD (gastroesophageal reflux disease) 01/12/2022    Past Surgical History:  Procedure Laterality Date   ANTERIOR CRUCIATE LIGAMENT REPAIR Right 10/29/2017   Procedure: RIGHT KNEE ARTHROSCOPY LATERAL MENISCAL REPAIR;  Surgeon: Cammy Copa, MD;  Location: [facility] OR;  Service: Orthopedics;  Laterality: Right;   CIRCUMCISION  1999   TYMPANOSTOMY TUBE PLACEMENT Bilateral 2000, 2001 and 2004   WISDOM TOOTH EXTRACTION         Home Medications    Prior to Admission medications   Medication Sig Start Date End Date Taking? Authorizing Provider  doxycycline  (VIBRAMYCIN) 100 MG capsule Take 1 capsule (100 mg total) by mouth 2 (two) times daily for 7 days. 10/05/22 10/12/22 Yes Valentino Nose, NP  famotidine-calcium carbonate-magnesium hydroxide (PEPCID COMPLETE) 10-800-165 MG chewable tablet Chew 1 tablet by mouth daily as needed (for heartburn).   Yes [provider]  Azelastine-Fluticasone 137-50 MCG/ACT SUSP Place 1 spray into the nose every 12 (twelve) hours. 01/12/22   Tommie Sams, DO  pantoprazole (PROTONIX) 40 MG tablet Take 1 tablet (40 mg total) by mouth daily. 01/12/22   Tommie Sams, DO    Family History Family History  Problem Relation Age of Onset   Hypertension Father    Hyperlipidemia Father    Snoring Father    Aneurysm Maternal Grandmother         Aneurysm behind eye   Aneurysm Maternal Grandfather        Has Aortic Aneurysm   Heart attack Paternal Grandmother        Died at 81   Stroke Paternal Grandfather        Died at 84    Social History Social History   Tobacco Use   Smoking status: Never   Smokeless tobacco: Never  Vaping Use   Vaping Use: Never used  Substance Use Topics   Alcohol use: Yes    Comment: every once in awhile  Drug use: No     Allergies   Patient has no known allergies.   Review of Systems Review of Systems Per HPI  Physical Exam Triage Vital Signs ED Triage Vitals  Enc Vitals Group     BP 10/05/22 1043 (!) 140/91     Pulse Rate 10/05/22 1043 90     Resp 10/05/22 1043 18     Temp 10/05/22 1042 99 F (37.2 C)     Temp Source 10/05/22 1042 Oral     SpO2 10/05/22 1043 96 %     Weight --      Height --      Head Circumference --      Peak Flow --      Pain Score 10/05/22 1039 3     Pain Loc --      Pain Edu? --      Excl. in GC? --    No data found.  Updated Vital Signs BP (!) 140/91 (BP Location: Right Arm)   Pulse 90   Temp 99 F (37.2 C) (Oral)   Resp 18   SpO2 96%   Visual Acuity Right Eye Distance:   Left Eye Distance:   Bilateral Distance:     Right Eye Near:   Left Eye Near:    Bilateral Near:     Physical Exam Vitals and nursing note reviewed.  Constitutional:      General: He is not in acute distress.    Appearance: Normal appearance. He is not toxic-appearing.  Pulmonary:     Effort: Pulmonary effort is normal. No respiratory distress.  Musculoskeletal:     Left lower leg: No edema.  Skin:    General: Skin is warm and dry.     Capillary Refill: Capillary refill takes less than 2 seconds.     Coloration: Skin is not jaundiced or pale.     Findings: No erythema.          Comments: Approximately 1 cm x 1 cm firm, slightly erythematous, indurated area.  There appear to be 2 pinpoint openings within the area.  No redness, active drainage, fluctuance, warmth, or tenderness to palpation.  Neurological:     Mental Status: He is alert and oriented to person, place, and time.     Motor: No weakness.     Gait: Gait normal.  Psychiatric:        Behavior: Behavior is cooperative.      UC Treatments / Results  Labs (all labs ordered are listed, but only abnormal results are displayed) Labs Reviewed - No data to display  EKG   Radiology No results found.  Procedures Procedures (including critical care time)  Medications Ordered in UC Medications - No data to display  Initial Impression / Assessment and Plan / UC Course  I have reviewed the triage vital signs and the nursing notes.  Pertinent labs & imaging results that were available during my care of the patient were reviewed by me and considered in my medical decision making (see chart for details).   Patient is well-appearing, normotensive, afebrile, not tachycardic, not tachypneic, oxygenating well on room air.    Abscess of left groin Suspect abscess of left groin Area appears to have already drained on its own Discussed continued supportive care with patient at home Prescription given for doxycycline if area begins to flareup/fill with pus  again ER and return precautions discussed with patient  The patient was given the opportunity to ask questions.  All  questions answered to their satisfaction.  The patient is in agreement to this plan.    Final Clinical Impressions(s) / UC Diagnoses   Final diagnoses:  Abscess of left groin     Discharge Instructions      The area in your groin today appears to be healing.  This may have been an abscess but does not look like it is currently infected.    Continue cleaning the area twice a day with a warm soap and water.  You can use a warm compress to help dry out any leftover infection.  This should heal over the next couple of days.  If the abscess gets worse, starts draining a lot of fluid, start the doxycycline and take the entire course.  You can take Tylenol or Motrin as needed for pain.     ED Prescriptions     Medication Sig Dispense Auth. Provider   doxycycline (VIBRAMYCIN) 100 MG capsule Take 1 capsule (100 mg total) by mouth 2 (two) times daily for 7 days. 14 capsule Valentino Nose, NP      PDMP not reviewed this encounter.   Valentino Nose, NP 10/05/22 1358

## 2022-10-05 NOTE — Discharge Instructions (Signed)
The area in your groin today appears to be healing.  This may have been an abscess but does not look like it is currently infected.    Continue cleaning the area twice a day with a warm soap and water.  You can use a warm compress to help dry out any leftover infection.  This should heal over the next couple of days.  If the abscess gets worse, starts draining a lot of fluid, start the doxycycline and take the entire course.  You can take Tylenol or Motrin as needed for pain.

## 2022-12-02 ENCOUNTER — Ambulatory Visit
Admission: RE | Admit: 2022-12-02 | Discharge: 2022-12-02 | Disposition: A | Discharge status: Home / Self Care | Payer: BC Managed Care – PPO | Source: Ambulatory Visit | Attending: Family Medicine | Admitting: Family Medicine

## 2022-12-02 ENCOUNTER — Other Ambulatory Visit: Payer: Self-pay

## 2022-12-02 VITALS — BP 133/85 | HR 80 | Temp 98.2°F | Resp 20

## 2022-12-02 DIAGNOSIS — J0100 Acute maxillary sinusitis, unspecified: Secondary | ICD-10-CM

## 2022-12-02 MED ORDER — AMOXICILLIN 875 MG PO TABS: 875.0000 mg | ORAL_TABLET | Freq: Two times a day (BID) | ORAL | 0 refills | Status: AC

## 2022-12-02 NOTE — ED Provider Notes (Signed)
Bradley Chapman   [id number] 12/02/22 Arrival Time: 2542  ASSESSMENT & PLAN:  1. Acute non-recurrent maxillary sinusitis    Begin: Meds ordered this encounter  Medications   amoxicillin (AMOXIL) 875 MG tablet    Sig: Take 1 tablet (875 mg total) by mouth 2 (two) times daily for 10 days.    Dispense:  20 tablet    Refill:  0   Discussed typical duration of symptoms. OTC symptom care as needed. Ensure adequate fluid intake and rest.   Follow-up Information     Coral Spikes, DO.   Specialty: Family Medicine Why: If worsening or failing to improve as anticipated. Contact information: [place] [address] [phone]                 Reviewed expectations re: course of current medical issues. Questions answered. Outlined signs and symptoms indicating need for more acute intervention. Patient verbalized understanding. After Visit Summary given.   SUBJECTIVE: History from: patient.  Bradley Chapman is a 27 y.o. male who presents with complaint of nasal congestion, post-nasal drainage, and sinus pain. Onset gradual, a week ago. Respiratory symptoms: none. Fever: absent. Overall normal PO intake without n/v. OTC treatment: none reported.  History of frequent sinus infections: no. No specific aggravating or alleviating factors reported. Social History   Tobacco Use  Smoking Status Never  Smokeless Tobacco Never    OBJECTIVE:  Vitals:   12/02/22 1918  BP: 133/85  Pulse: 80  Resp: 20  Temp: 98.2 F (36.8 C)  TempSrc: Oral  SpO2: 95%     General appearance: alert; no distress HEENT: nasal congestion; clear runny nose; throat irritation secondary to post-nasal drainage; bilateral maxillary tenderness to palpation; turbinates boggy Neck: supple without LAD; trachea midline Lungs: unlabored respirations, symmetrical air entry; cough: absent; no respiratory distress Skin: warm and dry Psychological: alert and cooperative; normal  mood and affect  No Known Allergies  Past Medical History:  Diagnosis Date   Anxiety    GERD (gastroesophageal reflux disease)    Family History  Problem Relation Age of Onset   Hypertension Father    Hyperlipidemia Father    Snoring Father    Aneurysm Maternal Grandmother         Aneurysm behind eye   Aneurysm Maternal Grandfather        Has Aortic Aneurysm   Heart attack Paternal Grandmother        Died at 72   Stroke Paternal Grandfather        Died at 52   Social History   Socioeconomic History   Marital status: Married    Spouse name: n/a   Number of children: 0   Years of education: Not on file   Highest education level: Not on file  Occupational History   Occupation: Ship broker    Comment: [employer]  Tobacco Use   Smoking status: Never   Smokeless tobacco: Never  Vaping Use   Vaping Use: Never used  Substance and Sexual Activity   Alcohol use: Yes    Comment: every once in awhile   Drug use: No   Sexual activity: Never  Other Topics Concern   Not on file  Social History Narrative   Lives with both parents in the same household.   Unsure what he wants to do in the future, but plans to attend college.      Update 03/09/2022   Lives at home with wife and  daughter   Right handed   Caffeine: about 5 cups/day   Social Determinants of Health   Financial Resource Strain: Not on file  Food Insecurity: Not on file  Transportation Needs: Not on file  Physical Activity: Not on file  Stress: Not on file  Social Connections: Not on file  Intimate Partner Violence: Not on file             Vanessa Kick, MD 12/02/22 [phone]

## 2022-12-02 NOTE — ED Triage Notes (Signed)
Pt reports nasal congestion, facial/ear pressure, headache x1 week. Pt reports has tried otc medication with minimal change in symptoms.

## 2023-06-04 ENCOUNTER — Ambulatory Visit: Payer: BC Managed Care – PPO | Admitting: Family Medicine

## 2023-06-04 VITALS — BP 114/65 | HR 84 | Wt 234.6 lb

## 2023-06-04 DIAGNOSIS — Z23 Encounter for immunization: Secondary | ICD-10-CM

## 2023-06-04 DIAGNOSIS — S61210A Laceration without foreign body of right index finger without damage to nail, initial encounter: Secondary | ICD-10-CM

## 2023-06-04 DIAGNOSIS — J019 Acute sinusitis, unspecified: Secondary | ICD-10-CM | POA: Diagnosis not present

## 2023-06-04 MED ORDER — AMOXICILLIN 500 MG PO TABS: 500.0000 mg | ORAL_TABLET | Freq: Three times a day (TID) | ORAL | 1 refills | Status: AC

## 2023-06-04 NOTE — Progress Notes (Signed)
   Subjective:    Patient ID: Bradley Chapman, male    DOB: December 20, 1995, 27 y.o.   MRN: [id number]  Otalgia  There is pain in the right ear. Episode onset: July 16th. There has been no fever. Associated symptoms include headaches. He has tried acetaminophen and NSAIDs for the symptoms. The treatment provided mild relief. There is no history of hearing loss.  Patient states everything muffled.   Patient relates couple weeks of head congestion drainage coughing and has also been bringing up a lot of yellow-green phlegm and states this been going on for the past couple weeks   Review of Systems  HENT:  Positive for ear pain.   Neurological:  Positive for headaches.       Objective:   Physical Exam Gen-NAD not toxic TMS-normal bilateral T- normal no redness Chest-CTA respiratory rate normal no crackles CV RRR no murmur Skin-warm dry Neuro-grossly normal Mild sinus tenderness       Assessment & Plan:  Sign cystitis Hearing should become normal over the next 2 weeks if not referral to ENT Antibiotics prescribed warning signs discussed Doubt COVID No sign of toxicity or pneumonia
